# Patient Record
Sex: Male | Born: 2000
Health system: Southern US, Community
[De-identification: ages and names within clinical notes are randomized; demographics above are authoritative.]

## PROBLEM LIST (undated history)

## (undated) DIAGNOSIS — G43909 Migraine, unspecified, not intractable, without status migrainosus: Secondary | ICD-10-CM

## (undated) HISTORY — PX: CHOLECYSTECTOMY: SHX55

## (undated) HISTORY — PX: APPENDECTOMY: SHX54

## (undated) HISTORY — PX: TONSILLECTOMY AND ADENOIDECTOMY: SUR1326

---
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
## 2020-01-01 LAB — URIC ACID: Uric Acid, Serum: 6.6 mg/dL (ref 2.1–7.6)

## 2020-01-01 LAB — CK: Total CK: 77 U/L (ref <245)

## 2020-01-02 ENCOUNTER — Other Ambulatory Visit: Payer: Self-pay

## 2020-01-02 ENCOUNTER — Ambulatory Visit (INDEPENDENT_AMBULATORY_CARE_PROVIDER_SITE_OTHER): Payer: BLUE CROSS/BLUE SHIELD | Admitting: Physician Assistant

## 2020-01-02 ENCOUNTER — Encounter: Payer: Self-pay | Admitting: Physician Assistant

## 2020-01-02 VITALS — BP 128/80 | HR 61 | Temp 98.6°F | Ht 70.0 in | Wt 194.0 lb

## 2020-01-02 DIAGNOSIS — Z23 Encounter for immunization: Secondary | ICD-10-CM

## 2020-01-02 DIAGNOSIS — M7989 Other specified soft tissue disorders: Secondary | ICD-10-CM

## 2020-01-02 MED ORDER — KETOROLAC TROMETHAMINE 60 MG/2ML IM SOLN
60.0000 mg | Freq: Once | INTRAMUSCULAR | Status: AC
  Administered 2020-01-02: 60 mg via INTRAMUSCULAR

## 2020-01-02 NOTE — Patient Instructions (Addendum)
It was great to see you!  Toradol injection today Start oral diclofenac tomorrow  Labs today to check for autoimmune issues Will go ahead and put in a rheumatology consult so they can schedule you  An order for an xray has been put in for you. To get your xray, you can walk in at the [facility] location without a scheduled appointment.  The address is [address] It is across the street from [facility] X-ray is located in the basement.  Hours of operation are M-F 8:30am to 5:00pm. Please note that they are closed for lunch between 12:30 and 1:00pm.  If your swelling worsens, pain, numbness or tingling worsens, there is a medical emergency called compartment syndrome that can develop. If you find that you symptoms are worsening, please go the ER.  Jarold Motto PA-C

## 2020-01-02 NOTE — Progress Notes (Signed)
Scott Durham is a 19 y.o. male here for a new problem.  I acted as a Neurosurgeon for [employer], PA-C Corky Mull, LPN   History of Present Illness:   Chief Complaint  Patient presents with  . Edema     HPI   Edema Pt c/o bilateral hand swelling, right hand has numbness and tingling in pinky and middle finger. Left hand numbness and tingling in whole and and all fingers. Started on Saturday. He went to Urgent care on Sunday and they prescribed prednisone, not helping.  Denies unintentional weight changes, unusual abdominal pain, changes to hand hygiene (gloves/chemicals, etc.)  Denies recent URI symptoms. He has reduced grip strength  Had a normal CK, uric acid at the urgent care. Had a WBC count drawn but I am unable to see these results.  Past Medical History:  Diagnosis Date  . Migraine    saw neurology in teenage years- now off all medicines. had been on amitriptyline, sumatriptan, and nauseas medicine      Social History   Tobacco Use  . Smoking status: Never Smoker  . Smokeless tobacco: Never Used  Vaping Use  . Vaping Use: Never used  Substance Use Topics  . Alcohol use: No  . Drug use: Yes    Types: Marijuana    Past Surgical History:  Procedure Laterality Date  . APPENDECTOMY    . CHOLECYSTECTOMY    . TONSILLECTOMY AND ADENOIDECTOMY      Family History  Problem Relation Age of Onset  . Hiatal hernia Mother   . GER disease Mother   . Other Father        unknown history  . Hyperlipidemia Maternal Grandmother   . Hyperlipidemia Maternal Grandfather   . Aortic aneurysm Paternal Grandmother        had surgery  . Other Paternal Grandfather        back pain issue,   . Cancer - Other Paternal Grandfather     Allergies  Allergen Reactions  . Penicillins Rash  . Benzoyl Peroxide     hives  . Adhesive [Tape] Rash    Current Medications:   Current Outpatient Medications:  .  naproxen (NAPROSYN) 500 MG tablet, Take 1 tablet (500 mg total) by  mouth 2 (two) times daily., Disp: 30 tablet, Rfl: 0 .  predniSONE (DELTASONE) 20 MG tablet, Take one tab by mouth twice daily for 4 days, then one daily. Take with food., Disp: 12 tablet, Rfl: 0   Review of Systems:   ROS  Negative unless otherwise specified per HPI.  Vitals:   Vitals:   01/02/20 1001  BP: 128/80  Pulse: 61  Temp: 98.6 F (37 C)  TempSrc: Temporal  SpO2: 98%  Weight: 194 lb (88 kg)  Height: 5\' 10"  (1.778 m)     Body mass index is 27.84 kg/m.  Physical Exam:   Physical Exam Vitals and nursing note reviewed.  Constitutional:      General: He is not in acute distress.    Appearance: He is well-developed. He is not ill-appearing or toxic-appearing.  Cardiovascular:     Rate and Rhythm: Normal rate and regular rhythm.     Pulses: Normal pulses.     Heart sounds: Normal heart sounds, S1 normal and S2 normal.     Comments: No LE edema Normal cap refills in fingertips Pulmonary:     Effort: Pulmonary effort is normal.     Breath sounds: Normal breath sounds.  Musculoskeletal:  Comments: Mild swelling to bilateral hands, diffuse TTP to b/l thenar eminence Normal ROM; able to oppose thumb to fingers without issue No erythema or warmth  Skin:    General: Skin is warm and dry.  Neurological:     Mental Status: He is alert.     GCS: GCS eye subscore is 4. GCS verbal subscore is 5. GCS motor subscore is 6.     Comments: Grip strength 4/5 bilaterally Normal perceived sensation   Psychiatric:        Speech: Speech normal.        Behavior: Behavior normal. Behavior is cooperative.      Assessment and Plan:   Keelin was seen today for edema.  Diagnoses and all orders for this visit:  Bilateral hand swelling Also examined briefly by Dr. Jimmey Ralph. Differential broad, suspect potential autoimmune condition, will order relevant labs and obtain b/l hand xrays today. Stop prednisone. Toradol injection given today. Start oral diclofenac tomorrow. Has  free ROM and overall normal evidence of blood flow.   We did extensively review signs, symptoms and pathophysiology of compartment syndrome -- low threshold to go to the ER if worsening symptoms. Also, will place rheumatology referral for further work-up. -     CBC with Differential/Platelet; Future -     Comprehensive metabolic panel; Future -     Sedimentation rate; Future -     C-reactive protein; Future -     ANA; Future -     Rheumatoid Factor; Future -     DG Hand Complete Left; Future -     DG Hand Complete Right; Future -     ketorolac (TORADOL) injection 60 mg -     ANA -     Rheumatoid Factor -     C-reactive protein -     Sedimentation rate -     Comprehensive metabolic panel -     CBC with Differential/Platelet  CMA or LPN served as scribe during this visit. History, Physical, and Plan performed by medical provider. The above documentation has been reviewed and is accurate and complete.  Jarold Motto, PA-C

## 2020-01-03 ENCOUNTER — Other Ambulatory Visit: Payer: Self-pay | Admitting: *Deleted

## 2020-01-03 LAB — COMPREHENSIVE METABOLIC PANEL
AG Ratio: 2 (calc) (ref 1.0–2.5)
ALT: 20 U/L (ref 8–46)
AST: 17 U/L (ref 12–32)
Albumin: 4.5 g/dL (ref 3.6–5.1)
Alkaline phosphatase (APISO): 67 U/L (ref 46–169)
BUN: 13 mg/dL (ref 7–20)
CO2: 27 mmol/L (ref 20–32)
Calcium: 9.5 mg/dL (ref 8.9–10.4)
Chloride: 105 mmol/L (ref 98–110)
Creat: 0.86 mg/dL (ref 0.60–1.26)
Globulin: 2.3 g/dL (calc) (ref 2.1–3.5)
Glucose, Bld: 86 mg/dL (ref 65–99)
Potassium: 4.2 mmol/L (ref 3.8–5.1)
Sodium: 139 mmol/L (ref 135–146)
Total Bilirubin: 0.7 mg/dL (ref 0.2–1.1)
Total Protein: 6.8 g/dL (ref 6.3–8.2)

## 2020-01-03 LAB — CBC WITH DIFFERENTIAL/PLATELET
Absolute Monocytes: 710 cells/uL (ref 200–900)
Basophils Absolute: 40 cells/uL (ref 0–200)
Basophils Relative: 0.4 %
Eosinophils Absolute: 30 cells/uL (ref 15–500)
Eosinophils Relative: 0.3 %
HCT: 46.1 % (ref 36.0–49.0)
Hemoglobin: 15.6 g/dL (ref 12.0–16.9)
Lymphs Abs: 2480 cells/uL (ref 1200–5200)
MCH: 29.5 pg (ref 25.0–35.0)
MCHC: 33.8 g/dL (ref 31.0–36.0)
MCV: 87.1 fL (ref 78.0–98.0)
MPV: 9.6 fL (ref 7.5–12.5)
Monocytes Relative: 7.1 %
Neutro Abs: 6740 cells/uL (ref 1800–8000)
Neutrophils Relative %: 67.4 %
Platelets: 275 10*3/uL (ref 140–400)
RBC: 5.29 10*6/uL (ref 4.10–5.70)
RDW: 12.4 % (ref 11.0–15.0)
Total Lymphocyte: 24.8 %
WBC: 10 10*3/uL (ref 4.5–13.0)

## 2020-01-03 LAB — RHEUMATOID FACTOR: Rheumatoid fact SerPl-aCnc: <14 IU/mL (ref <14)

## 2020-01-03 LAB — ANA: Anti Nuclear Antibody (ANA): NEGATIVE

## 2020-01-03 LAB — SEDIMENTATION RATE: Sed Rate: 2 mm/h (ref 0–15)

## 2020-01-03 LAB — C-REACTIVE PROTEIN: CRP: 0.9 mg/L (ref <8.0)

## 2020-01-03 MED ORDER — PREDNISONE 20 MG PO TABS: ORAL_TABLET | ORAL | 0 refills | Status: DC

## 2020-01-03 MED ORDER — DICLOFENAC SODIUM 75 MG PO TBEC: 75.0000 mg | DELAYED_RELEASE_TABLET | Freq: Two times a day (BID) | ORAL | 0 refills | Status: DC

## 2020-01-03 MED FILL — DICLOFENAC SODIUM 75 MG TAB: 75 | 30 days supply | Qty: 60 | Fill #0

## 2020-01-04 ENCOUNTER — Other Ambulatory Visit: Payer: Self-pay

## 2020-01-04 ENCOUNTER — Ambulatory Visit (INDEPENDENT_AMBULATORY_CARE_PROVIDER_SITE_OTHER)
Admission: RE | Admit: 2020-01-04 | Discharge: 2020-01-04 | Disposition: A | Discharge status: Home / Self Care | Payer: BLUE CROSS/BLUE SHIELD | Source: Ambulatory Visit | Attending: Physician Assistant | Admitting: Physician Assistant

## 2020-01-04 DIAGNOSIS — M7989 Other specified soft tissue disorders: Secondary | ICD-10-CM | POA: Diagnosis not present

## 2020-01-04 IMAGING — DX DG HAND COMPLETE 3+V*L*
3 series · 3 of 3 positions shown · non-contrast
Comparison: None.

CLINICAL DATA: Pain and swelling for several days

EXAM:
LEFT HAND - COMPLETE 3+ VIEW

[hand ap]
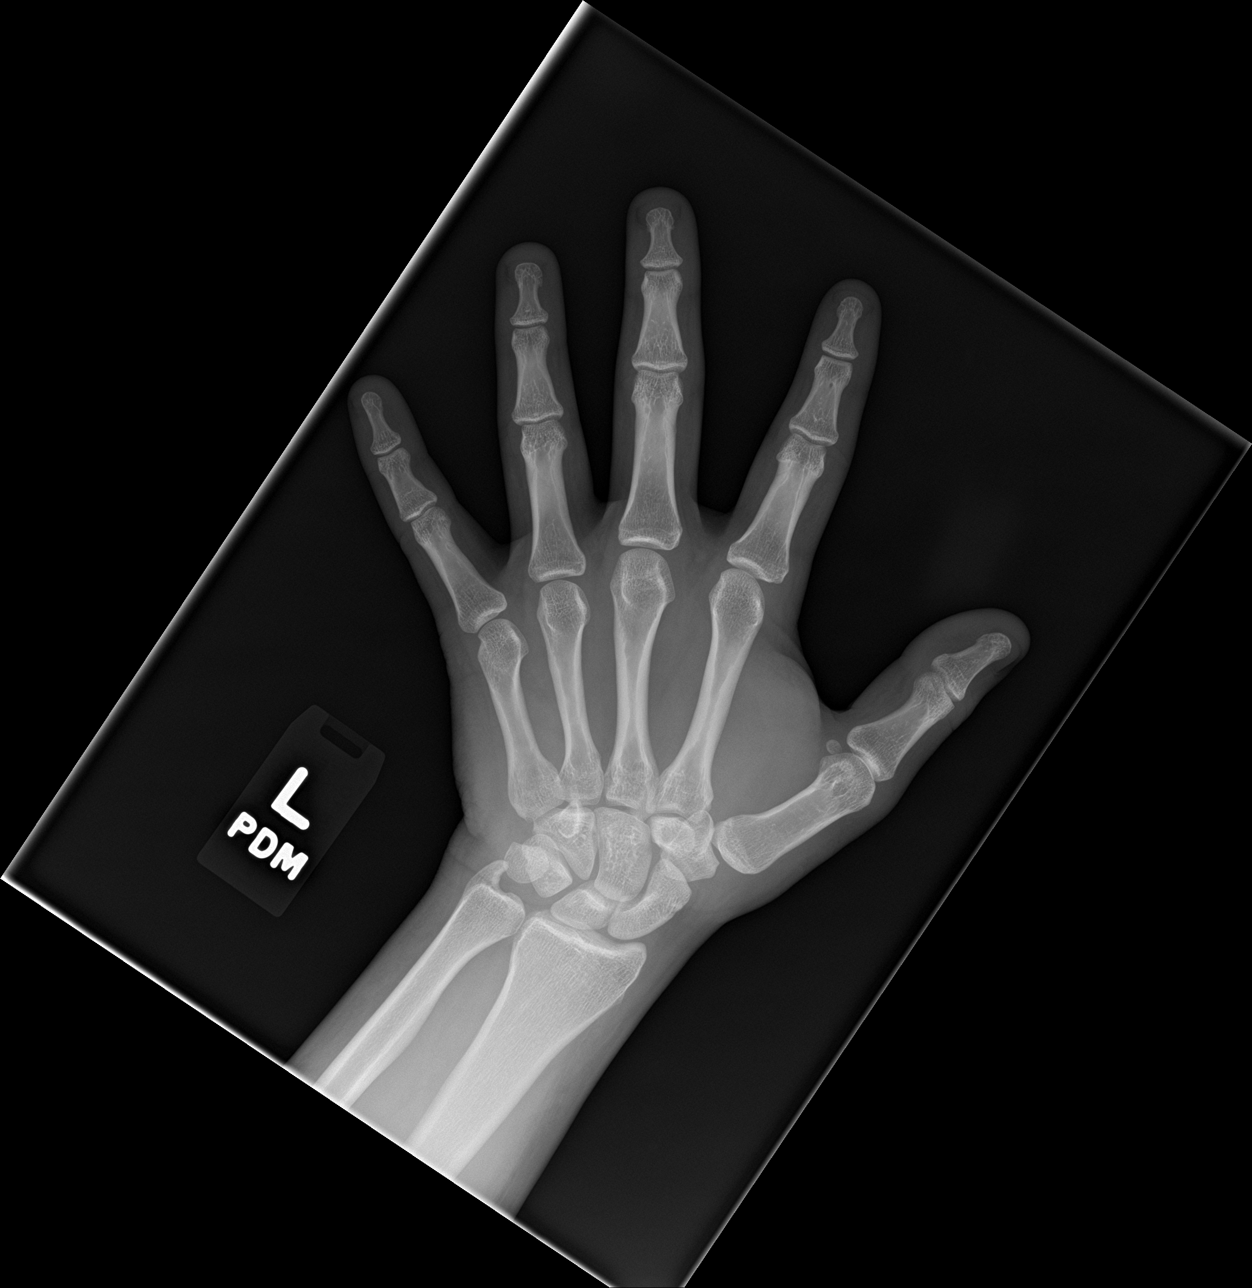

[hand obl]
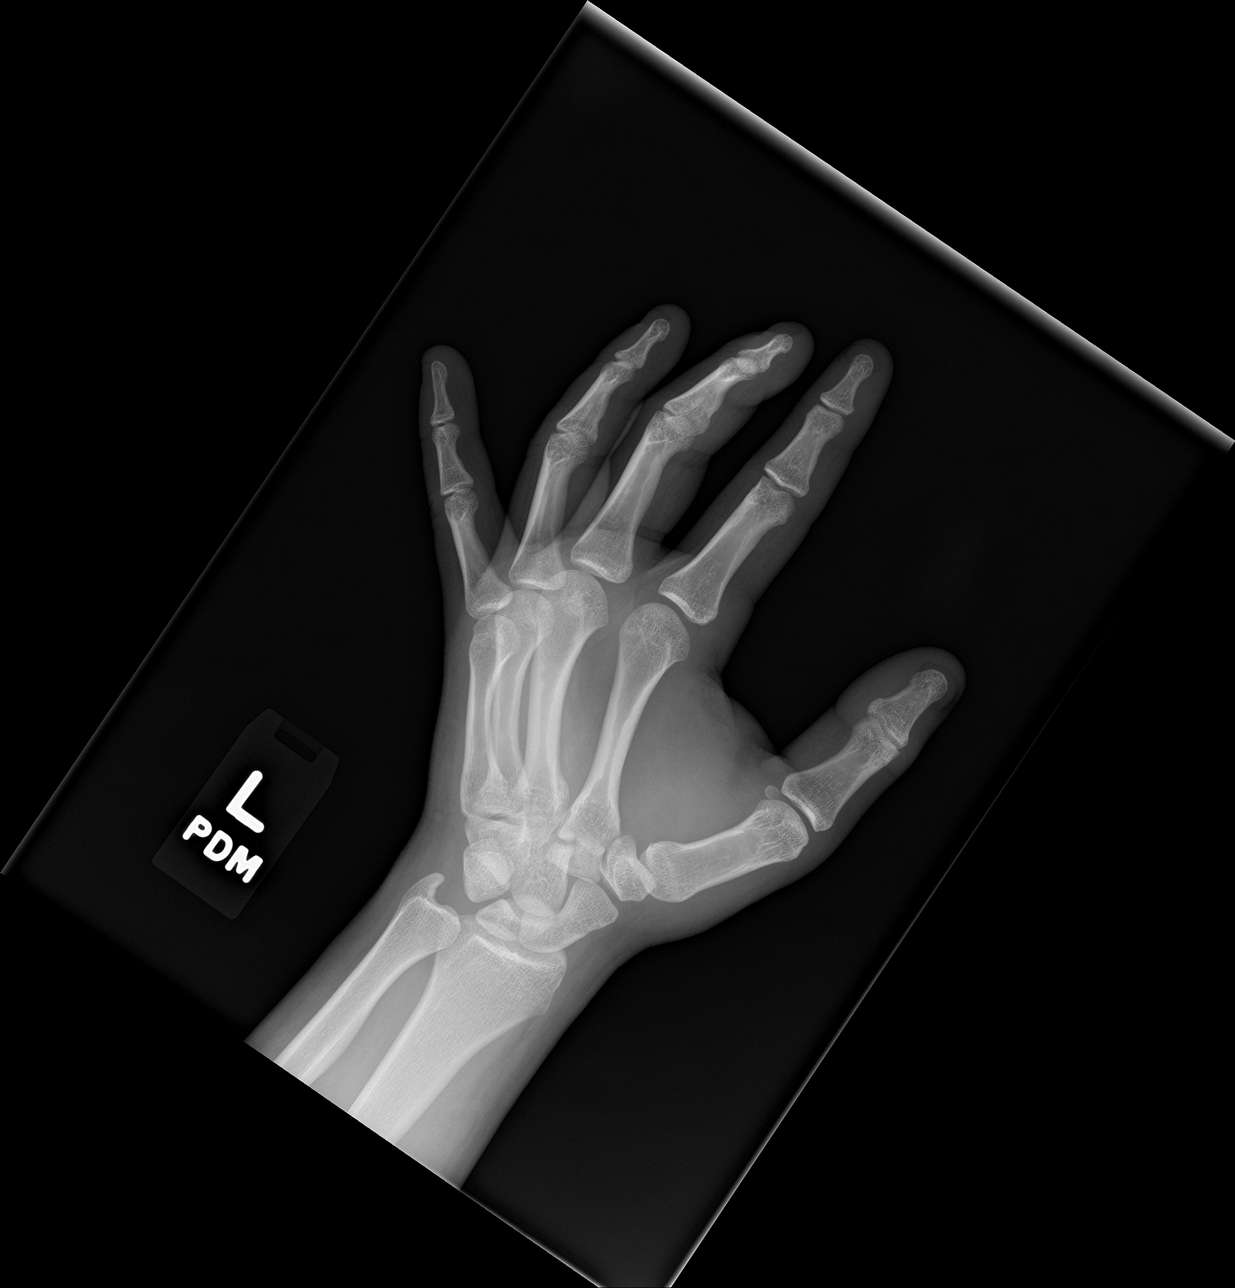

[hand lat]
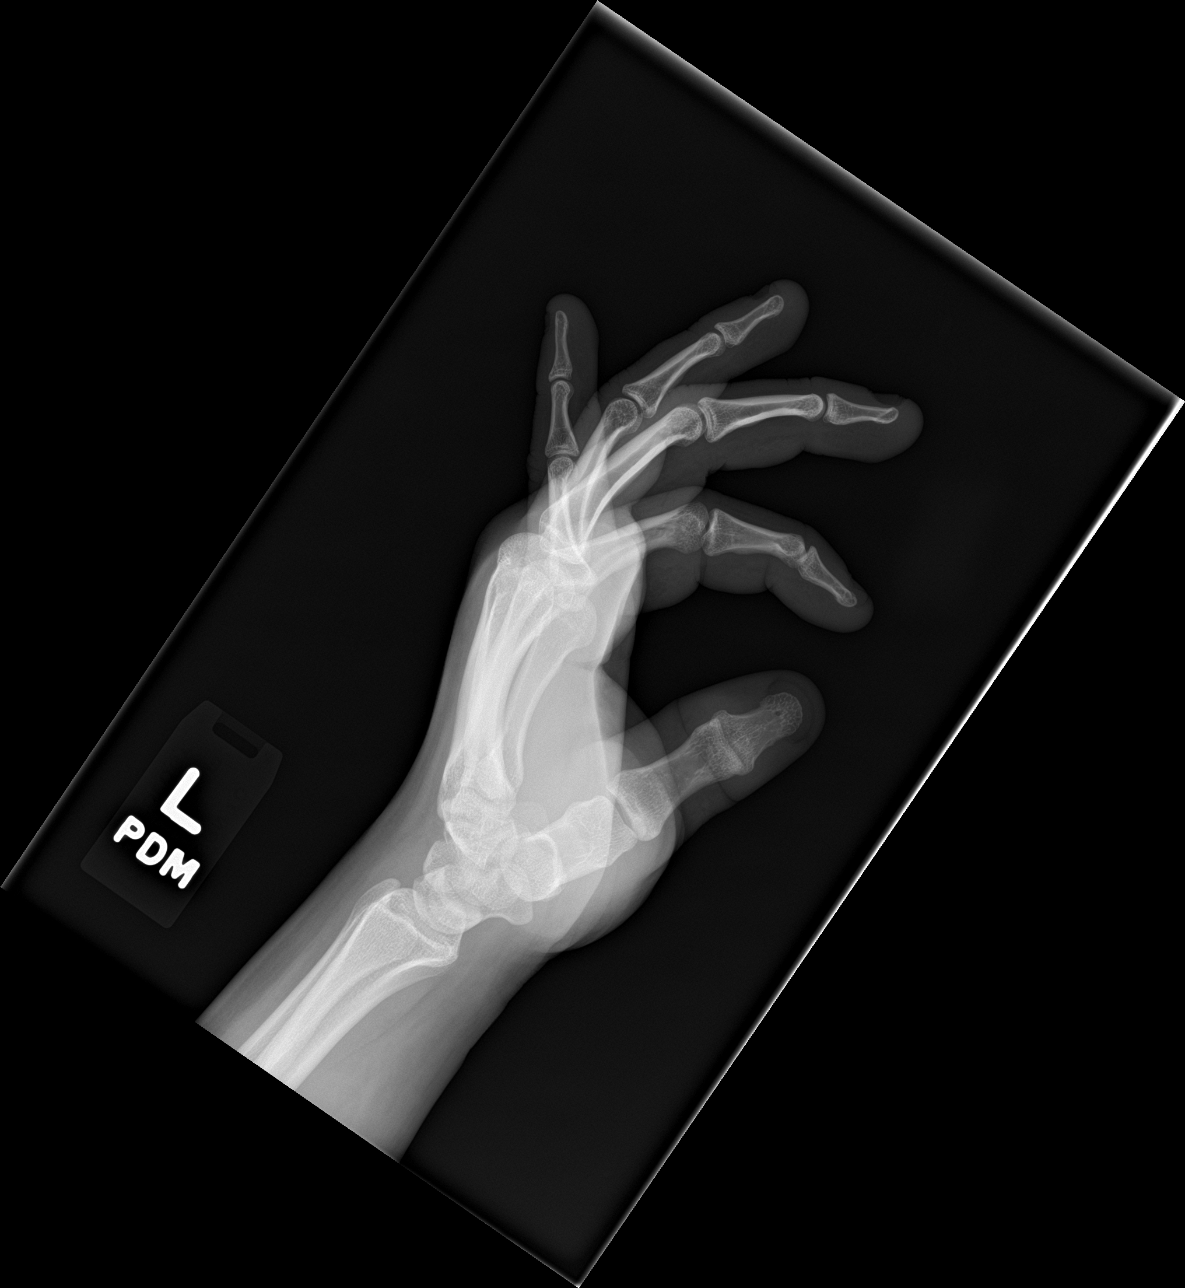

[3 of 3 positions shown; findings below may reference images not displayed]

FINDINGS: Frontal, oblique, and lateral views were obtained. No fracture or
dislocation. Joint spaces appear normal. No erosive change or
periostitis. Bony mineralization is normal.
IMPRESSION: No fracture or dislocation.  No evident arthropathy.

## 2020-01-04 IMAGING — DX DG HAND COMPLETE 3+V*R*
3 series · 3 of 3 positions shown · non-contrast
Comparison: None.

CLINICAL DATA: Pain and swelling

EXAM:
RIGHT HAND - COMPLETE 3+ VIEW

[hand ap]
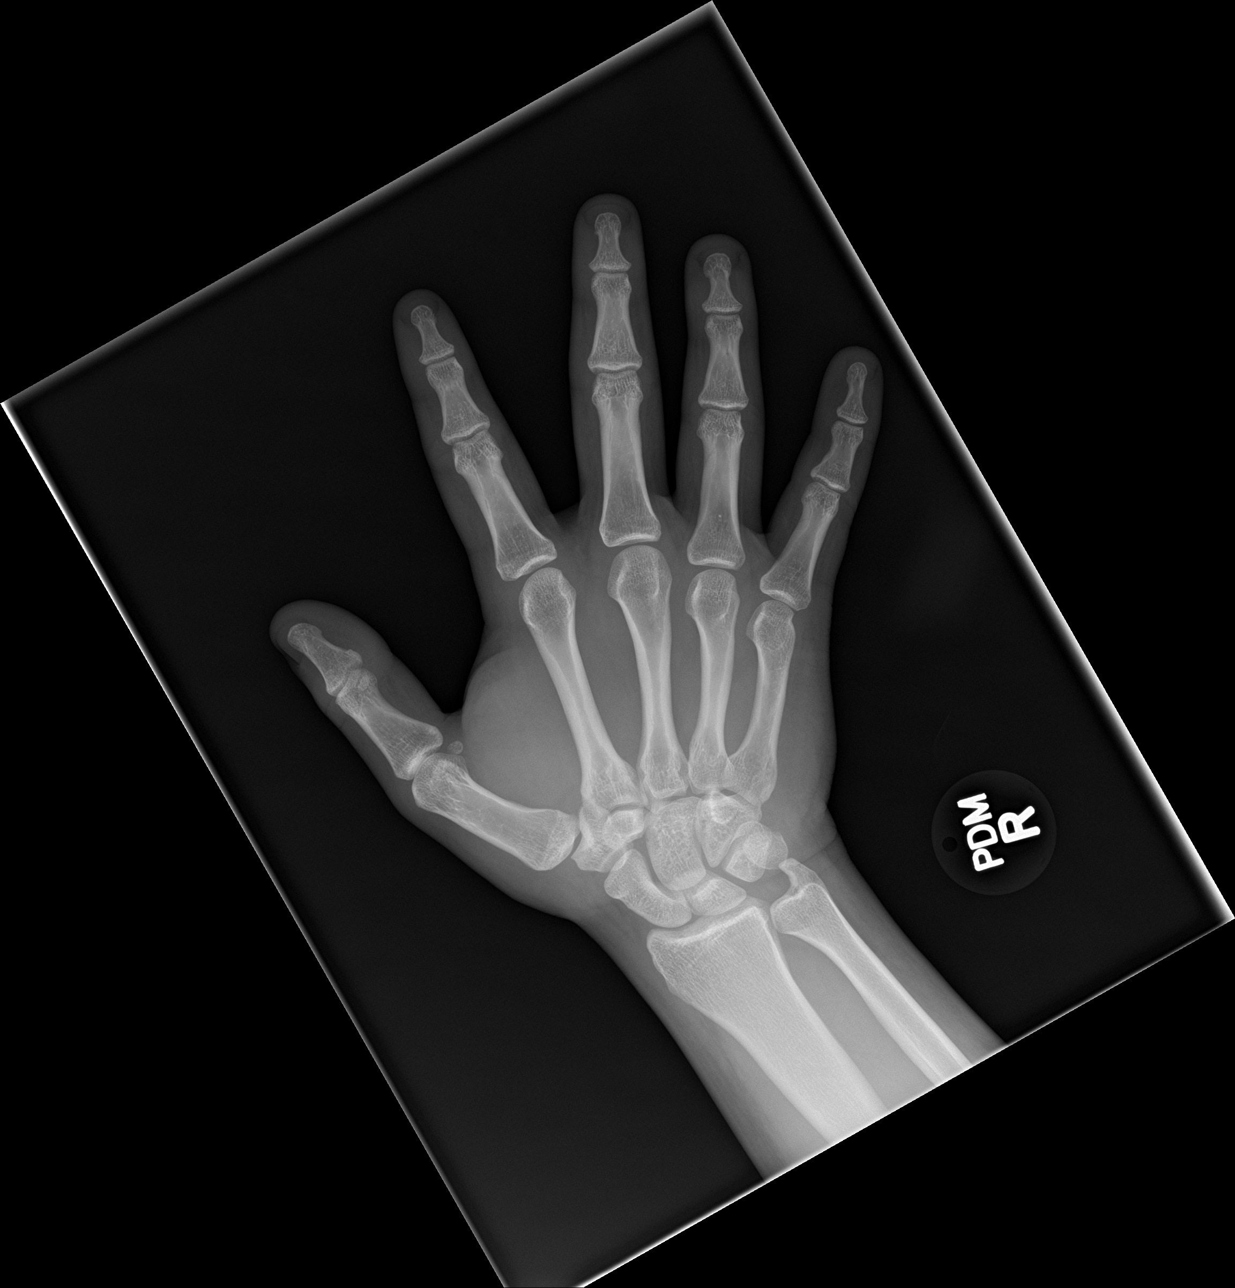

[hand obl]
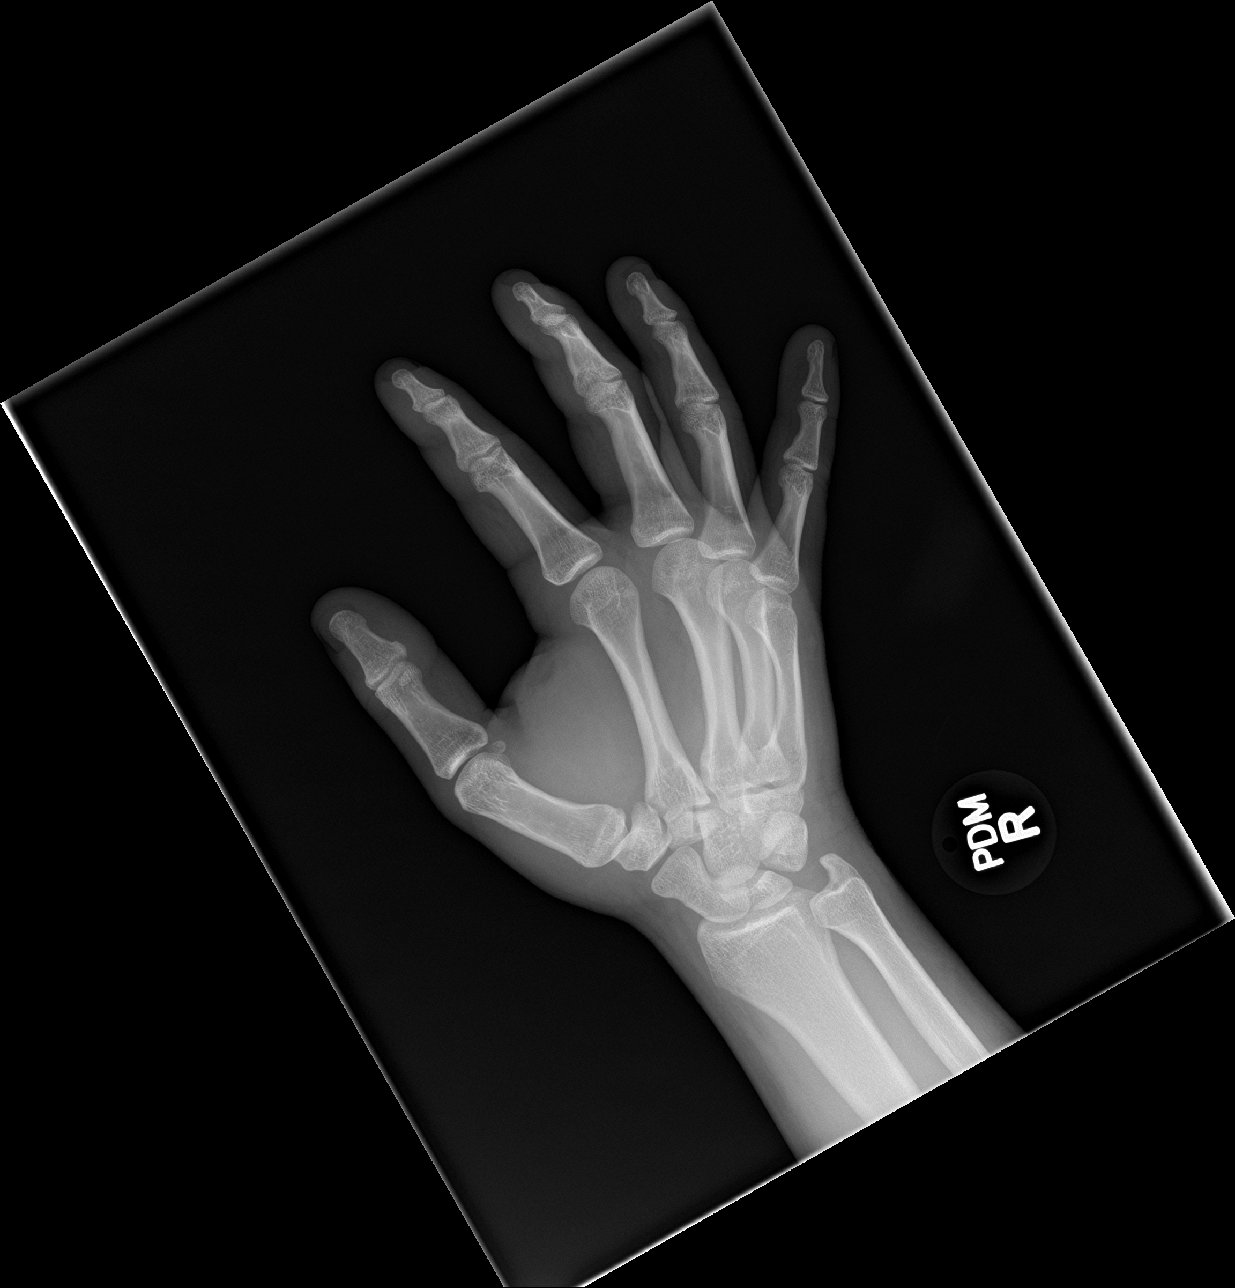

[hand lat]
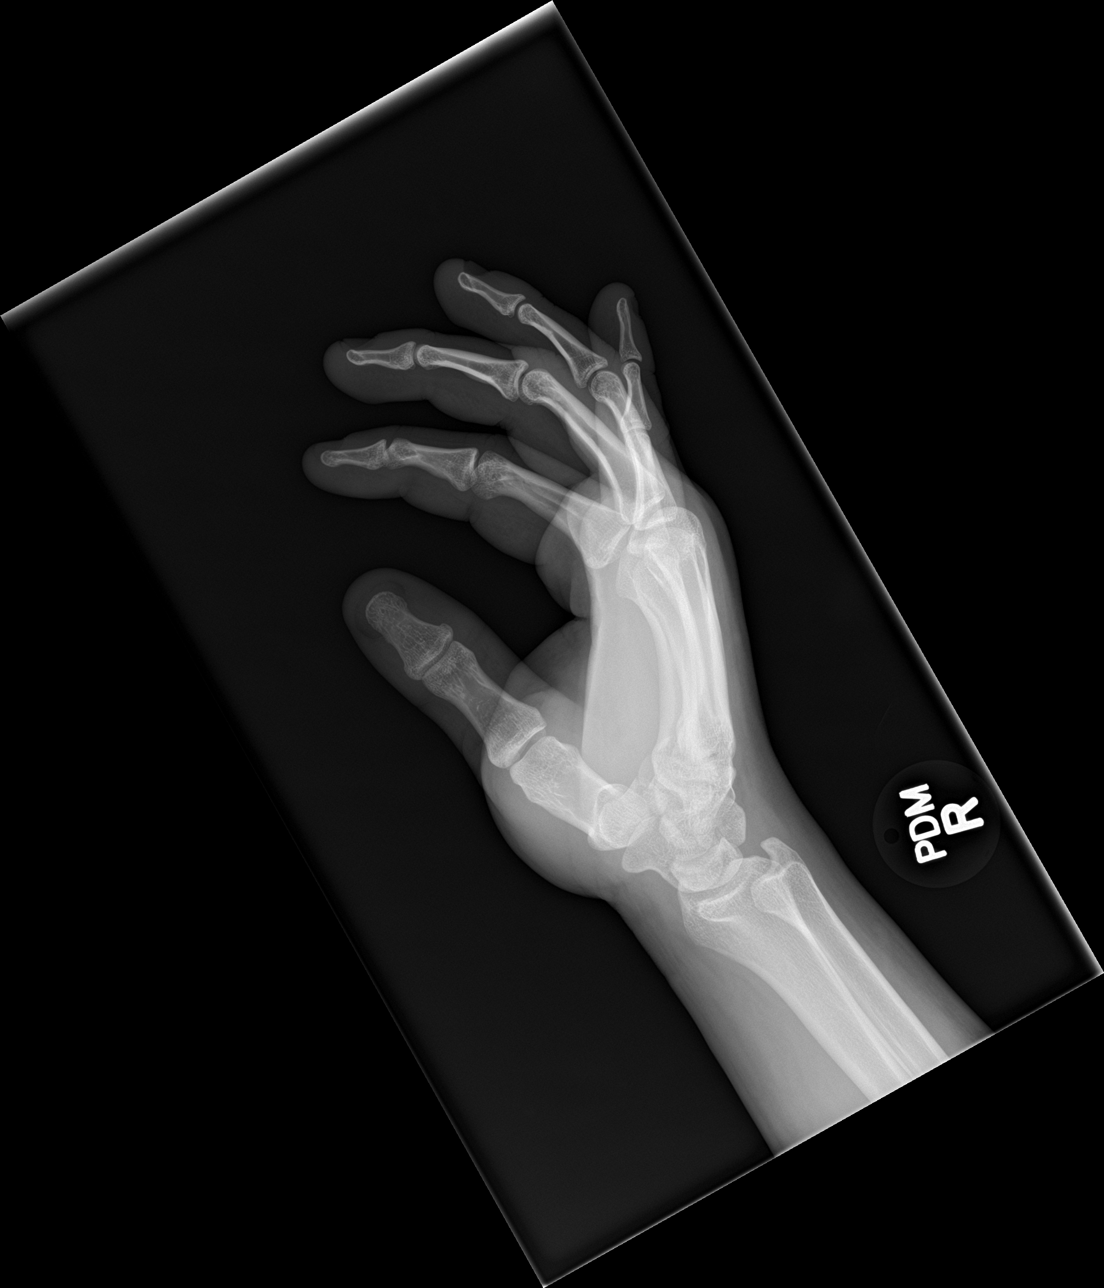

[3 of 3 positions shown; findings below may reference images not displayed]

FINDINGS: Frontal, oblique, and lateral views were obtained. There is no
fracture or dislocation. The joint spaces appear normal. No erosive
change or periostitis. Bony mineralization appears normal.
IMPRESSION: No fracture or dislocation.  No evident arthropathy.

## 2020-01-05 ENCOUNTER — Other Ambulatory Visit: Payer: Self-pay | Admitting: Physician Assistant

## 2020-01-05 MED ORDER — PREDNISONE 50 MG PO TABS: ORAL_TABLET | ORAL | 0 refills | Status: DC

## 2020-01-05 MED FILL — predniSONE 50 MG TABS: 50 | 7 days supply | Qty: 7 | Fill #0

## 2020-01-08 ENCOUNTER — Encounter: Payer: Self-pay | Admitting: Physician Assistant

## 2020-01-08 ENCOUNTER — Ambulatory Visit (INDEPENDENT_AMBULATORY_CARE_PROVIDER_SITE_OTHER): Payer: BLUE CROSS/BLUE SHIELD | Admitting: Physician Assistant

## 2020-01-08 ENCOUNTER — Other Ambulatory Visit: Payer: Self-pay | Admitting: Physician Assistant

## 2020-01-08 ENCOUNTER — Other Ambulatory Visit: Payer: Self-pay

## 2020-01-08 VITALS — BP 120/70 | HR 69 | Temp 98.4°F | Ht 70.0 in | Wt 198.2 lb

## 2020-01-08 DIAGNOSIS — M7989 Other specified soft tissue disorders: Secondary | ICD-10-CM | POA: Diagnosis not present

## 2020-01-08 MED ORDER — DICLOFENAC SODIUM 1 % EX GEL: 4.0000 g | Freq: Four times a day (QID) | CUTANEOUS | 3 refills | Status: DC

## 2020-01-08 MED FILL — DICLOFENAC SODIUM 1 % GEL: 1 | 6 days supply | Qty: 100 | Fill #0

## 2020-01-08 NOTE — Patient Instructions (Signed)
It was great to see you!  You will be referred to sports medicine - they will contact you about an appointment.  Trial topical voltaren four times daily -- you can buy this over the counter. Generic is fine.   Finish out the prednisone.  Take care,  Jarold Motto PA-C

## 2020-01-08 NOTE — Progress Notes (Signed)
Scott Durham is a 19 y.o. male is here for follow up.  I acted as a Scott Durham for [employer], PA-C Scott Mull, LPN   History of Present Illness:   Chief Complaint  Patient presents with  . hand swelling    HPI   Hand swelling Pt here today for f/u, says hands are worse and more numbness. Has taken 50 mg oral prednisone without significant relief of symptoms. Reports ongoing worsening grip strength as well. Has been pulled out of work due to his symptoms.   Health Maintenance Due  Topic Date Due  . Hepatitis C Screening  Never done    Past Medical History:  Diagnosis Date  . Migraine    saw neurology in teenage years- now off all medicines. had been on amitriptyline, sumatriptan, and nauseas medicine      Social History   Tobacco Use  . Smoking status: Never Smoker  . Smokeless tobacco: Never Used  Vaping Use  . Vaping Use: Never used  Substance Use Topics  . Alcohol use: No  . Drug use: Yes    Types: Marijuana    Past Surgical History:  Procedure Laterality Date  . APPENDECTOMY    . CHOLECYSTECTOMY    . TONSILLECTOMY AND ADENOIDECTOMY      Family History  Problem Relation Age of Onset  . Hiatal hernia Mother   . GER disease Mother   . Other Father        unknown history  . Hyperlipidemia Maternal Grandmother   . Hyperlipidemia Maternal Grandfather   . Aortic aneurysm Paternal Grandmother        had surgery  . Other Paternal Grandfather        back pain issue,   . Cancer - Other Paternal Grandfather     PMHx, SurgHx, SocialHx, FamHx, Medications, and Allergies were reviewed in the Visit Navigator and updated as appropriate.   Patient Active Problem List   Diagnosis Date Noted  . Migraine   . Allergic rhinitis 07/03/2015  . Acne vulgaris 07/03/2015    Social History   Tobacco Use  . Smoking status: Never Smoker  . Smokeless tobacco: Never Used  Vaping Use  . Vaping Use: Never used  Substance Use Topics  . Alcohol use: No  .  Drug use: Yes    Types: Marijuana    Current Medications and Allergies:    Current Outpatient Medications:  .  diclofenac Sodium (VOLTAREN) 1 % GEL, Apply 4 g topically 4 (four) times daily., Disp: 50 g, Rfl: 3 .  predniSONE (DELTASONE) 50 MG tablet, Take 1 tablet daily, Disp: 7 tablet, Rfl: 0   Allergies  Allergen Reactions  . Penicillins Rash  . Benzoyl Peroxide     hives  . Adhesive [Tape] Rash    Review of Systems   ROS  Negative unless otherwise specified per HPI.  Vitals:   Vitals:   01/08/20 1012  BP: 120/70  Pulse: 69  Temp: 98.4 F (36.9 C)  TempSrc: Temporal  SpO2: 97%  Weight: 198 lb 4 oz (89.9 kg)  Height: 5\' 10"  (1.778 m)     Body mass index is 28.45 kg/m.   Physical Exam:    Physical Exam Musculoskeletal:     Comments: Bilateral hands with generalized swelling Tinel's sign positive Reduced ROM with gripping 2/2 swelling and pain  Neurological:     Comments: Decreased grip strength b/l Decreased perceived sensation to bilateral fingertips      Assessment and Plan:  Scott Durham was seen today for hand swelling.  Diagnoses and all orders for this visit:  Bilateral hand swelling Urgent referral to sports medicine for further evaluation and management. Continue oral prednisone. Topical voltaren QID as needed. -     Ambulatory referral to Sports Medicine  Other orders -     diclofenac Sodium (VOLTAREN) 1 % GEL; Apply 4 g topically 4 (four) times daily.  CMA or LPN served as scribe during this visit. History, Physical, and Plan performed by medical provider. The above documentation has been reviewed and is accurate and complete.  Scott Motto, PA-C [place] [place] 01/08/2020  Follow-up: No follow-ups on file.

## 2020-01-09 ENCOUNTER — Ambulatory Visit (INDEPENDENT_AMBULATORY_CARE_PROVIDER_SITE_OTHER): Payer: BLUE CROSS/BLUE SHIELD | Admitting: Family Medicine

## 2020-01-09 ENCOUNTER — Other Ambulatory Visit: Payer: Self-pay | Admitting: Family Medicine

## 2020-01-09 ENCOUNTER — Ambulatory Visit: Payer: Self-pay

## 2020-01-09 ENCOUNTER — Encounter: Payer: Self-pay | Admitting: Family Medicine

## 2020-01-09 VITALS — BP 122/82 | HR 74 | Ht 70.0 in | Wt 198.0 lb

## 2020-01-09 DIAGNOSIS — R200 Anesthesia of skin: Secondary | ICD-10-CM | POA: Diagnosis not present

## 2020-01-09 DIAGNOSIS — G5603 Carpal tunnel syndrome, bilateral upper limbs: Secondary | ICD-10-CM | POA: Diagnosis not present

## 2020-01-09 MED ORDER — GABAPENTIN 300 MG PO CAPS: 300.0000 mg | ORAL_CAPSULE | Freq: Three times a day (TID) | ORAL | 3 refills | Status: DC | PRN

## 2020-01-09 MED FILL — GABAPENTIN 300 MG CAPSULE: 300 | 30 days supply | Qty: 90 | Fill #0

## 2020-01-09 NOTE — Progress Notes (Signed)
Subjective:    I'm seeing this patient as a consultation for:  Scott Durham, [place] and Dr. Durene Cal. Note will be routed back to referring provider/PCP.  CC: B hand swelling and numbness/tingling  I, Debbe Odea, am serving as a Neurosurgeon for Dr. Clementeen Graham.  HPI: Pt is an 19 y/o male presenting w/ c/o B hand swelling and numbness/tingling that began on 12/30/19.  He has been seen at Urgent Care and twice by his PCP w/ similar c/o.  He has been referred to Rheumatology appointment 01/31/2020 for first appointment. He denies any change in activity or injury. He was prescribed prednisone twice for this which made no difference. He has not had trials of gabapentin or Lyrica yet. He notes some numbness and tingling throughout his entire hand but predominantly located at the radial side of his hands on both sides.  Swelling: yes in B hands UE numbness/tingling: Yes in R 3rd and 5th fingers and L entire hand  UE weakness: yes, decreased grip strength Aggravating factors: gripping Treatments tried: prednisone; Toradol injection; oral diclofenac; voltaren gel  Diagnostic imaging: R and L hand XR- 01/04/20  Past medical history, Surgical history, Family history, Social history, Allergies, and medications have been entered into the medical record, reviewed.   Review of Systems: No new headache, visual changes, nausea, vomiting, diarrhea, constipation, dizziness, abdominal pain, skin rash, fevers, chills, night sweats, weight loss, swollen lymph nodes, body aches, joint swelling, muscle aches, chest pain, shortness of breath, mood changes, visual or auditory hallucinations.   Objective:    Vitals:   01/09/20 1112  BP: 122/82  Pulse: 74  SpO2: 99%   General: Well Developed, well nourished, and in no acute distress.  Neuro/Psych: Alert and oriented x3, extra-ocular muscles intact, able to move all 4 extremities, sensation grossly intact. Skin: Warm and dry, no rashes noted.  Respiratory: Not using  accessory muscles, speaking in full sentences, trachea midline.  Cardiovascular: Pulses palpable, no extremity edema. Abdomen: Does not appear distended. MSK: Hands bilaterally are normal-appearing with no significant swelling ulnar deviation. Not particularly tender to palpation. Positive Tinel's bilateral carpal tunnel and cubital tunnel. Positive Phalen's test bilaterally. Grip strength is intact distally bilaterally.  Lab and Radiology Results  Diagnostic Limited MSK Ultrasound of: Bilateral carpal tunnel Right carpal tunnel: Flattened median nerve with cross-sectional area of 17 mm. Left carpal tunnel flattened median nerve with cross-sectional area of 13 mm. Impression: Mild carpal tunnel syndrome left worse than right  DG Ankle Complete Left  Result Date: 11/20/2019 CLINICAL DATA:  Crush injury. Crush injury to the foot and ankle tonight at work. Bruising swelling, and cannot bear weight. EXAM: LEFT ANKLE COMPLETE - 3+ VIEW COMPARISON:  None. FINDINGS: There is no evidence of fracture or dislocation. The ankle mortise is preserved. There is no evidence of arthropathy or other focal bone abnormality. Small ankle joint effusion. Soft tissue edema appears more prominent laterally. IMPRESSION: 1. Soft tissue edema and small joint effusion. 2. No fracture or dislocation. Electronically Signed   By: Narda Rutherford M.D.   On: 11/20/2019 00:09   DG Hand Complete Left  Result Date: 01/05/2020 CLINICAL DATA:  Pain and swelling for several days EXAM: LEFT HAND - COMPLETE 3+ VIEW COMPARISON:  None. FINDINGS: Frontal, oblique, and lateral views were obtained. No fracture or dislocation. Joint spaces appear normal. No erosive change or periostitis. Bony mineralization is normal. IMPRESSION: No fracture or dislocation.  No evident arthropathy. Electronically Signed   By: Bretta Bang III  M.D.   On: 01/05/2020 14:34   DG Hand Complete Right  Result Date: 01/05/2020 CLINICAL DATA:  Pain and  swelling EXAM: RIGHT HAND - COMPLETE 3+ VIEW COMPARISON:  None. FINDINGS: Frontal, oblique, and lateral views were obtained. There is no fracture or dislocation. The joint spaces appear normal. No erosive change or periostitis. Bony mineralization appears normal. IMPRESSION: No fracture or dislocation.  No evident arthropathy. Electronically Signed   By: Bretta BangWilliam  Woodruff III M.D.   On: 01/05/2020 14:35   DG Foot Complete Left  Result Date: 11/20/2019 CLINICAL DATA:  Crush injury. Crush injury to the foot and ankle tonight at work. Bruising swelling, and cannot bear weight. EXAM: LEFT FOOT - COMPLETE 3+ VIEW COMPARISON:  None. FINDINGS: There is no evidence of fracture or dislocation. There is no evidence of arthropathy or other focal bone abnormality. Soft tissue edema about the hindfoot. IMPRESSION: Soft tissue edema.  No fracture or dislocation. Electronically Signed   By: Narda RutherfordMelanie  Sanford M.D.   On: 11/20/2019 00:10   I, Clementeen GrahamEvan Merdith Boyd, personally (independently) visualized and performed the interpretation of the hand images attached in this note.  Recent Results (from the past 2160 hour(s))  POCT CBC w auto diff     Status: None   Collection Time: 12/31/19  2:17 PM  Result Value Ref Range   WBC      Comment: see scanned results   Lymphocytes relative %     Monocytes relative %     Neutrophils relative % (GR)     Lymphocytes absolute     Monocyes absolute     Neutrophils absolute (GR#)     RBC     Hemoglobin     Hematocrit     MCV     MCH     MCHC     RDW     Platelet count     MPV    Uric acid     Status: None   Collection Time: 12/31/19  2:17 PM  Result Value Ref Range   Uric Acid, Serum 6.6 2.1 - 7.6 mg/dL  CK     Status: None   Collection Time: 12/31/19  2:17 PM  Result Value Ref Range   Total CK 77 <245 U/L  ANA     Status: None   Collection Time: 01/02/20 11:09 AM  Result Value Ref Range   Anti Nuclear Antibody (ANA) NEGATIVE NEGATIVE    Comment: ANA IFA is a first line  screen for detecting the presence of up to approximately 150 autoantibodies in various autoimmune diseases. A negative ANA IFA result suggests an ANA-associated autoimmune disease is not present at this time, but is not definitive. If there is high clinical suspicion for Sjogren's syndrome, testing for anti-SS-A/Ro antibody should be considered. Anti-Jo-1 antibody should be considered for clinically suspected inflammatory myopathies. . AC-0: Negative . International Consensus on ANA Patterns (SeverTies.uyhttps://doi.org/10.1515/cclm-2018-0052) . For additional information, please refer to http://education.[employer].com/faq/FAQ177 (This link is being provided for informational/ educational purposes only.) .   Rheumatoid Factor     Status: None   Collection Time: 01/02/20 11:09 AM  Result Value Ref Range   Rhuematoid fact SerPl-aCnc <14 <14 IU/mL  C-reactive protein     Status: None   Collection Time: 01/02/20 11:09 AM  Result Value Ref Range   CRP 0.9 <8.0 mg/L  Sedimentation rate     Status: None   Collection Time: 01/02/20 11:09 AM  Result Value Ref Range   Sed Rate 2 0 -  15 mm/h  Comprehensive metabolic panel     Status: None   Collection Time: 01/02/20 11:09 AM  Result Value Ref Range   Glucose, Bld 86 65 - 99 mg/dL    Comment: .            Fasting reference interval .    BUN 13 7 - 20 mg/dL   Creat 7.10 6.26 - 9.48 mg/dL   BUN/Creatinine Ratio NOT APPLICABLE 6 - 22 (calc)   Sodium 139 135 - 146 mmol/L   Potassium 4.2 3.8 - 5.1 mmol/L   Chloride 105 98 - 110 mmol/L   CO2 27 20 - 32 mmol/L   Calcium 9.5 8.9 - 10.4 mg/dL   Total Protein 6.8 6.3 - 8.2 g/dL   Albumin 4.5 3.6 - 5.1 g/dL   Globulin 2.3 2.1 - 3.5 g/dL (calc)   AG Ratio 2.0 1.0 - 2.5 (calc)   Total Bilirubin 0.7 0.2 - 1.1 mg/dL   Alkaline phosphatase (APISO) 67 46 - 169 U/L   AST 17 12 - 32 U/L   ALT 20 8 - 46 U/L  CBC with Differential/Platelet     Status: None   Collection Time: 01/02/20 11:09 AM    Result Value Ref Range   WBC 10.0 4.5 - 13.0 Thousand/uL   RBC 5.29 4.10 - 5.70 Million/uL   Hemoglobin 15.6 12.0 - 16.9 g/dL   HCT 54.6 36 - 49 %   MCV 87.1 78.0 - 98.0 fL   MCH 29.5 25.0 - 35.0 pg   MCHC 33.8 31.0 - 36.0 g/dL   RDW 27.0 35.0 - 09.3 %   Platelets 275 140 - 400 Thousand/uL   MPV 9.6 7.5 - 12.5 fL   Neutro Abs 6,740 1,800 - 8,000 cells/uL   Lymphs Abs 2,480 1,200 - 5,200 cells/uL   Absolute Monocytes 710 200 - 900 cells/uL   Eosinophils Absolute 30 15 - 500 cells/uL   Basophils Absolute 40 0 - 200 cells/uL   Neutrophils Relative % 67.4 %   Total Lymphocyte 24.8 %   Monocytes Relative 7.1 %   Eosinophils Relative 0.3 %   Basophils Relative 0.4 %      Impression and Recommendations:    Assessment and Plan: 19 y.o. male with bilateral hand and wrist pain swelling numbness and tingling. Patient had rheumatologic work-up that so far has been unrevealing. He has had trials of steroids which have not been helpful. I think it is pretty unlikely that he has a rheumatologic problem but not impossible I do agree with his rheumatology appointment that scheduled for later this month.  However I think some of his symptoms today are due to carpal tunnel syndrome. It is also likely that he has some cubital tunnel syndrome as well. Differential does include more rare diagnoses such as thoracic outlet syndrome but this is very unlikely.  Plan to treat with wrist splints and gabapentin. Recheck in 1 month if not improved would consider carpal tunnel injection/nerve hydrodissection.Marland Kitchen  PDMP not reviewed this encounter. Orders Placed This Encounter  Procedures  . [place] LIMITED JOINT SPACE STRUCTURES UP BILAT(NO LINKED CHARGES)    Order Specific Question:   Reason for Exam (SYMPTOM  OR DIAGNOSIS REQUIRED)    Answer:   wrist    Order Specific Question:   Preferred imaging location?    Answer:   [facility] ordered this encounter  Medications  .  gabapentin (NEURONTIN) 300 MG capsule    Sig: Take  1 capsule (300 mg total) by mouth 3 (three) times daily as needed (nerve pain).    Dispense:  90 capsule    Refill:  3    Discussed warning signs or symptoms. Please see discharge instructions. Patient expresses understanding.   The above documentation has been reviewed and is accurate and complete Clementeen Graham, M.D.

## 2020-01-09 NOTE — Patient Instructions (Signed)
Thank you for coming in today.  Please go to [facility] supply to get the wrist brace we talked about today. You may also be able to get it from [employer].   Take the gabapentin at bedtime and up to 3x daily as needed for nerve pain.  Recheck in 1 month.  Let me know sooner if this is not working. Can do an injection sooner.    Carpal Tunnel Syndrome  Carpal tunnel syndrome is a condition that causes pain in your hand and arm. The carpal tunnel is a narrow area that is on the palm side of your wrist. Repeated wrist motion or certain diseases may cause swelling in the tunnel. This swelling can pinch the main nerve in the wrist (median nerve). What are the causes? This condition may be caused by:  Repeated wrist motions.  Wrist injuries.  Arthritis.  A sac of fluid (cyst) or abnormal growth (tumor) in the carpal tunnel.  Fluid buildup during pregnancy. Sometimes the cause is not known. What increases the risk? The following factors may make you more likely to develop this condition:  Having a job in which you move your wrist in the same way many times. This includes jobs like being a Midwife or a Conservation officer, nature.  Being a woman.  Having other health conditions, such as: ? Diabetes. ? Obesity. ? A thyroid gland that is not active enough (hypothyroidism). ? Kidney failure. What are the signs or symptoms? Symptoms of this condition include:  A tingling feeling in your fingers.  Tingling or a loss of feeling (numbness) in your hand.  Pain in your entire arm. This pain may get worse when you bend your wrist and elbow for a long time.  Pain in your wrist that goes up your arm to your shoulder.  Pain that goes down into your palm or fingers.  A weak feeling in your hands. You may find it hard to grab and hold items. You may feel worse at night. How is this diagnosed? This condition is diagnosed with a medical history and physical exam. You may also have tests, such  as:  Electromyogram (EMG). This test checks the signals that the nerves send to the muscles.  Nerve conduction study. This test checks how well signals pass through your nerves.  Imaging tests, such as X-rays, ultrasound, and MRI. These tests check for what might be the cause of your condition. How is this treated? This condition may be treated with:  Lifestyle changes. You will be asked to stop or change the activity that caused your problem.  Doing exercise and activities that make bones and muscles stronger (physical therapy).  Learning how to use your hand again (occupational therapy).  Medicines for pain and swelling (inflammation). You may have injections in your wrist.  A wrist splint.  Surgery. Follow these instructions at home: If you have a splint:  Wear the splint as told by your doctor. Remove it only as told by your doctor.  Loosen the splint if your fingers: ? Tingle. ? Lose feeling (become numb). ? Turn cold and blue.  Keep the splint clean.  If the splint is not waterproof: ? Do not let it get wet. ? Cover it with a watertight covering when you take a bath or a shower. Managing pain, stiffness, and swelling   If told, put ice on the painful area: ? If you have a removable splint, remove it as told by your doctor. ? Put ice in a plastic bag. ?  Place a towel between your skin and the bag. ? Leave the ice on for 20 minutes, 2-3 times per day. General instructions  Take over-the-counter and prescription medicines only as told by your doctor.  Rest your wrist from any activity that may cause pain. If needed, talk with your boss at work about changes that can help your wrist heal.  Do any exercises as told by your doctor, physical therapist, or occupational therapist.  Keep all follow-up visits as told by your doctor. This is important. Contact a doctor if:  You have new symptoms.  Medicine does not help your pain.  Your symptoms get worse. Get  help right away if:  You have very bad numbness or tingling in your wrist or hand. Summary  Carpal tunnel syndrome is a condition that causes pain in your hand and arm.  It is often caused by repeated wrist motions.  Lifestyle changes and medicines are used to treat this problem. Surgery may help in very bad cases.  Follow your doctor's instructions about wearing a splint, resting your wrist, keeping follow-up visits, and calling for help. This information is not intended to replace advice given to you by your health care provider. Make sure you discuss any questions you have with your health care provider. Document Revised: 07/30/2017 Document Reviewed: 07/30/2017 Elsevier Patient Education  2020 [employer].

## 2020-01-15 ENCOUNTER — Encounter: Payer: Self-pay | Admitting: Family Medicine

## 2020-01-15 ENCOUNTER — Ambulatory Visit (INDEPENDENT_AMBULATORY_CARE_PROVIDER_SITE_OTHER): Payer: BLUE CROSS/BLUE SHIELD | Admitting: Family Medicine

## 2020-01-15 ENCOUNTER — Other Ambulatory Visit: Payer: Self-pay

## 2020-01-15 ENCOUNTER — Ambulatory Visit: Payer: Self-pay

## 2020-01-15 VITALS — BP 140/90 | HR 103 | Ht 70.0 in | Wt 196.0 lb

## 2020-01-15 DIAGNOSIS — G5603 Carpal tunnel syndrome, bilateral upper limbs: Secondary | ICD-10-CM

## 2020-01-15 NOTE — Progress Notes (Signed)
Scott Durham is a 19 y.o. male who presents to [facility] today for follow up of bilateral hand numbness. Patient was last seen by Dr. Denyse Amass Patient had rheumatologic work-up that so far has been unrevealing. He has had trials of steroids which have not been helpful. I think it is pretty unlikely that he has a rheumatologic problem but not impossible some of his symptoms today are due to carpal tunnel syndrome. It is also likely that he has some cubital tunnel syndrome as well. Patient was informed to get a brace, given gabapentin, and told to follow up for injections if needed. Patient states that the gabapentin is not helping has been taking it since his last visit. Patient wearing a brace on the right hand and doesn't seem to be helping. Right hand palm side base of thumb is the worst pain. Left hand is a sharp pain sometimes and dull ache all the time. The pain seems to be getting worse not better.   He has initial appointment with rheumatology at the end of the month.   Pertinent review of systems: No fevers or chills  Relevant historical information: Migraine history   Exam:  BP 140/90 (BP Location: Left Arm, Patient Position: Sitting, Cuff Size: Normal)   Pulse (!) 103   Ht 5\' 10"  (1.778 m)   Wt 196 lb (88.9 kg)   SpO2 99%   BMI 28.12 kg/m  General: Well Developed, well nourished, and in no acute distress.   MSK: Bilateral hands and wrist normal-appearing positive Tinel's carpal tunnel    Lab and Radiology Results  Procedure: Real-time Ultrasound Guided hydrodissection of median nerve for left carpal tunnel Device: Philips Affiniti 50G Images permanently stored and available for review in PACS Verbal informed consent obtained.  Discussed risks and benefits of procedure. Warned about infection bleeding damage to structures skin hypopigmentation and fat atrophy among others. Patient expresses understanding and agreement Time-out conducted.     Noted no overlying erythema, induration, or other signs of local infection.   Skin prepped in a sterile fashion.   Local anesthesia: Topical Ethyl chloride.   With sterile technique and under real time ultrasound guidance:  40 mg of Kenalog and 1 mL of lidocaine injected into the carpal tunnel. Fluid seen entering the carpal tunnel around the median nerve.   Completed without difficulty   Pain immediately resolved suggesting accurate placement of the medication.   Advised to call if fevers/chills, erythema, induration, drainage, or persistent bleeding.   Images permanently stored and available for review in the ultrasound unit.  Impression: Technically successful ultrasound guided injection.   Procedure: Real-time Ultrasound Guided I hydrodissection of median nerve and right carpal tunnel Device: Philips Affiniti 50G Images permanently stored and available for review in PACS Verbal informed consent obtained.  Discussed risks and benefits of procedure. Warned about infection bleeding damage to structures skin hypopigmentation and fat atrophy among others. Patient expresses understanding and agreement Time-out conducted.   Noted no overlying erythema, induration, or other signs of local infection.   Skin prepped in a sterile fashion.   Local anesthesia: Topical Ethyl chloride.   With sterile technique and under real time ultrasound guidance:  40 mg of Kenalog and 1 mL of lidocaine injected into the carpal tunnel. Fluid seen entering the carpal tunnel around the median nerve.   Completed without difficulty   Pain immediately resolved suggesting accurate placement of the medication.   Advised to call if fevers/chills, erythema, induration,  drainage, or persistent bleeding.   Images permanently stored and available for review in the ultrasound unit.  Impression: Technically successful ultrasound guided injection.     Assessment and Plan: 19 y.o. male with bilateral carpal tunnel syndrome.   Patient also likely does have possibility of rheumatologic disease.  Plan for bilateral carpal tunnel injections/median nerve hydrodissection today.  Additionally follow-up with rheumatology as scheduled at the end of this month.  Continue night splints and recheck back as needed.   PDMP not reviewed this encounter. Orders Placed This Encounter  Procedures  . [place] LIMITED JOINT SPACE STRUCTURES UP LEFT(NO LINKED CHARGES)    Standing Status:   Future    Number of Occurrences:   1    Standing Expiration Date:   01/14/2021    Order Specific Question:   Reason for Exam (SYMPTOM  OR DIAGNOSIS REQUIRED)    Answer:   Bilateral wrist pain    Order Specific Question:   Preferred imaging location?    Answer:   [facility]   No orders of the defined types were placed in this encounter.    Discussed warning signs or symptoms. Please see discharge instructions. Patient expresses understanding.   The above documentation has been reviewed and is accurate and complete Clementeen Graham, M.D.

## 2020-01-15 NOTE — Patient Instructions (Signed)
Thank you for coming in today.  Call or go to the ER if you develop a large red swollen joint with extreme pain or oozing puss.   Use the splints.   Let me know if this is not working.   If better I will write you to return to work.  If not better let me know.

## 2020-01-22 ENCOUNTER — Telehealth: Payer: Self-pay | Admitting: Family Medicine

## 2020-01-22 ENCOUNTER — Encounter: Payer: Self-pay | Admitting: Neurology

## 2020-01-22 DIAGNOSIS — R200 Anesthesia of skin: Secondary | ICD-10-CM

## 2020-01-22 DIAGNOSIS — G5603 Carpal tunnel syndrome, bilateral upper limbs: Secondary | ICD-10-CM

## 2020-01-22 NOTE — Telephone Encounter (Signed)
Returned [employer] and she informed me that Altonio can't be seen by Neurology until Dec. 7th.  He has Rheumatology on Oct. 27th.  Please advise if you would like to try a different neurology group for pt to possibly be seen sooner.

## 2020-01-22 NOTE — Telephone Encounter (Signed)
Next step from my perspective is a nerve conduction study.  This is typically done at neurology.  I have placed an order now for this to be done however it typically will take a few weeks to get scheduled.  You also has evaluation with rheumatology on the 27th.  Recheck with me following nerve conduction study.  Typically give it about a week after the study to have the results back.

## 2020-01-22 NOTE — Telephone Encounter (Signed)
Called and LM for pt's mother Judeth Cornfield and asked for her to return call to discuss next steps for [facility].  Please advise per Dr. Zollie Pee phone note.

## 2020-01-22 NOTE — Telephone Encounter (Signed)
Patient's mother called stating that he is still having pain and swelling even after the cortisone injection last week. She asked what he should do next?  Please advise.

## 2020-01-23 NOTE — Telephone Encounter (Signed)
I am ordering a special test called nerve conduction study.  Typically it is pretty hard to get that done very quickly.  I am not optimistic about other groups getting you in sooner.

## 2020-01-29 ENCOUNTER — Ambulatory Visit: Payer: BLUE CROSS/BLUE SHIELD | Admitting: Internal Medicine

## 2020-01-31 ENCOUNTER — Ambulatory Visit: Payer: BLUE CROSS/BLUE SHIELD | Admitting: Internal Medicine

## 2020-01-31 NOTE — Progress Notes (Deleted)
Office Visit Note  Patient: Scott Durham             Date of Birth: 2000/05/21           MRN: [id number]             PCP: Marin Olp, MD Referring: Inda Coke, PA Visit Date: 01/31/2020 Occupation: _0 @  Subjective:  No chief complaint on file.   History of Present Illness: Scott Durham is a 19 y.o. male here for evaluation of bilateral hand pain and swelling.***   Labs reviewed 12/2019 RF negative ANA negative ESR normal CRP normal CK normal Uric acid normal  Activities of Daily Living:  Patient reports morning stiffness for *** {minute/hour:19697}.   Patient {ACTIONS;DENIES/REPORTS:21021675::"Denies"} nocturnal pain.  Difficulty dressing/grooming: {ACTIONS;DENIES/REPORTS:21021675::"Denies"} Difficulty climbing stairs: {ACTIONS;DENIES/REPORTS:21021675::"Denies"} Difficulty getting out of chair: {ACTIONS;DENIES/REPORTS:21021675::"Denies"} Difficulty using hands for taps, buttons, cutlery, and/or writing: {ACTIONS;DENIES/REPORTS:21021675::"Denies"}  No Rheumatology ROS completed.   PMFS History:  Patient Active Problem List   Diagnosis Date Noted  . Bilateral carpal tunnel syndrome 01/09/2020  . Migraine   . Allergic rhinitis 07/03/2015  . Acne vulgaris 07/03/2015    Past Medical History:  Diagnosis Date  . Migraine    saw neurology in teenage years- now off all medicines. had been on amitriptyline, sumatriptan, and nauseas medicine     Family History  Problem Relation Age of Onset  . Hiatal hernia Mother   . GER disease Mother   . Other Father        unknown history  . Hyperlipidemia Maternal Grandmother   . Hyperlipidemia Maternal Grandfather   . Aortic aneurysm Paternal Grandmother        had surgery  . Other Paternal Grandfather        back pain issue,   . Cancer - Other Paternal Grandfather    Past Surgical History:  Procedure Laterality Date  . APPENDECTOMY    . CHOLECYSTECTOMY    . TONSILLECTOMY AND ADENOIDECTOMY     Social  History   Social History Narrative   Single- lives with mom.       Planning on [place] and considering university after that. Wants to work on stock/financing.    [employer]th grade in 2021.    Working Visual merchandiser   Prior [employer], Engineer, water      Hobbies: time with friends   Immunization History  Administered Date(s) Administered  . DTaP 05/30/2001, 07/28/2001, 10/13/2001, 02/09/2003, 06/23/2005  . HPV 9-valent 07/09/2014  . HPV Quadrivalent 07/09/2014, 04/19/2015  . Hepatitis B 10/07/00, 04/29/2001, 10/13/2001  . HiB (PRP-OMP) 05/30/2001, 07/28/2001, 10/13/2001, 06/18/2002  . IPV 05/30/2001, 07/28/2001, 08/09/2002, 02/09/2003, 06/23/2005  . Influenza,inj,Quad PF,6+ Mos 12/28/2015, 01/02/2020  . Influenza-Unspecified 12/19/2010, 12/21/2011, 03/22/2013, 03/22/2013, 04/19/2015, 12/28/2015, 12/31/2016  . MMR 06/23/2002, 06/23/2005  . Meningococcal B, OMV 05/02/2019, 07/10/2019  . Meningococcal Conjugate 03/22/2013, 05/02/2019  . Pneumococcal-Unspecified 05/30/2001, 07/28/2001, 10/13/2001, 02/09/2003  . Tdap 08/12/2011, 08/12/2011, 07/04/2018, 02/13/2019  . Varicella 06/23/2002, 06/23/2005     Objective: Vital Signs: There were no vitals taken for this visit.   Physical Exam   Musculoskeletal Exam: ***  CDAI Exam: CDAI Score: -- Patient Global: --; Provider Global: -- Swollen: --; Tender: -- Joint Exam 01/31/2020   No joint exam has been documented for this visit   There is currently no information documented on the homunculus. Go to the Rheumatology activity and complete the homunculus joint exam.  Investigation: No additional findings.  Imaging: DG Hand Complete Left  Result Date: 01/05/2020 CLINICAL DATA:  Pain and swelling for several days EXAM: LEFT HAND - COMPLETE 3+ VIEW COMPARISON:  None. FINDINGS: Frontal, oblique, and lateral views were obtained. No fracture or dislocation. Joint spaces appear normal. No erosive change or periostitis. Bony mineralization is  normal. IMPRESSION: No fracture or dislocation.  No evident arthropathy. Electronically Signed   By: Lowella Grip III M.D.   On: 01/05/2020 14:34   DG Hand Complete Right  Result Date: 01/05/2020 CLINICAL DATA:  Pain and swelling EXAM: RIGHT HAND - COMPLETE 3+ VIEW COMPARISON:  None. FINDINGS: Frontal, oblique, and lateral views were obtained. There is no fracture or dislocation. The joint spaces appear normal. No erosive change or periostitis. Bony mineralization appears normal. IMPRESSION: No fracture or dislocation.  No evident arthropathy. Electronically Signed   By: Lowella Grip III M.D.   On: 01/05/2020 14:35   [place] LIMITED JOINT SPACE STRUCTURES UP BILAT(NO LINKED CHARGES)  Result Date: 01/24/2020 Diagnostic Limited MSK Ultrasound of: Bilateral carpal tunnel Right carpal tunnel: Flattened median nerve with cross-sectional area of 17 mm. Left carpal tunnel flattened median nerve with cross-sectional area of 13 mm. Impression: Mild carpal tunnel syndrome left worse than right   [place] LIMITED JOINT SPACE STRUCTURES UP LEFT(NO LINKED CHARGES)  Result Date: 01/24/2020 Procedure: Real-time Ultrasound Guided hydrodissection of median nerve for left carpal tunnel Device: Philips Affiniti 50G Images permanently stored and available for review in PACS Verbal informed consent obtained. Discussed risks and benefits of procedure. Warned about infection bleeding damage to structures skin hypopigmentation and fat atrophy among others. Patient expresses understanding and agreement Time-out conducted.  Noted no overlying erythema, induration, or other signs of local infection.  Skin prepped in a sterile fashion.  Local anesthesia: Topical Ethyl chloride.  With sterile technique and under real time ultrasound guidance: 40 mg of Kenalog and 1 mL of lidocaine injected into the carpal tunnel. Fluid seen entering the carpal tunnel around the median nerve.  Completed without difficulty  Pain  immediately resolved suggesting accurate placement of the medication.  Advised to call if fevers/chills, erythema, induration, drainage, or persistent bleeding.  Images permanently stored and available for review in the ultrasound unit. Impression: Technically successful ultrasound guided injection.   Procedure: Real-time Ultrasound Guided I hydrodissection of median nerve and right carpal tunnel Device: Philips Affiniti 50G Images permanently stored and available for review in PACS Verbal informed consent obtained. Discussed risks and benefits of procedure. Warned about infection bleeding damage to structures skin hypopigmentation and fat atrophy among others. Patient expresses understanding and agreement Time-out conducted.  Noted no overlying erythema, induration, or other signs of local infection.  Skin prepped in a sterile fashion.  Local anesthesia: Topical Ethyl chloride.  With sterile technique and under real time ultrasound guidance: 40 mg of Kenalog and 1 mL of lidocaine injected into the carpal tunnel. Fluid seen entering the carpal tunnel around the median nerve.  Completed without difficulty  Pain immediately resolved suggesting accurate placement of the medication.  Advised to call if fevers/chills, erythema, induration, drainage, or persistent bleeding.  Images permanently stored and available for review in the ultrasound unit. Impression: Technically successful ultrasound guided injection.   Recent Labs: Lab Results  Component Value Date   WBC 10.0 01/02/2020   HGB 15.6 01/02/2020   PLT 275 01/02/2020   NA 139 01/02/2020   K 4.2 01/02/2020   CL 105 01/02/2020   CO2 27 01/02/2020   GLUCOSE 86 01/02/2020   BUN 13 01/02/2020   CREATININE 0.86 01/02/2020   BILITOT  0.7 01/02/2020   ALKPHOS 76 07/10/2019   AST 17 01/02/2020   ALT 20 01/02/2020   PROT 6.8 01/02/2020   ALBUMIN 4.9 07/10/2019   CALCIUM 9.5 01/02/2020   GFRAA NOT CALCULATED 11/06/2016    Speciality  Comments: No specialty comments available.  Procedures:  No procedures performed Allergies: Penicillins, Benzoyl peroxide, and Adhesive [tape]   Assessment / Plan:     Visit Diagnoses: No diagnosis found.  Orders: No orders of the defined types were placed in this encounter.  No orders of the defined types were placed in this encounter.   Face-to-face time spent with patient was *** minutes. Greater than 50% of time was spent in counseling and coordination of care.  Follow-Up Instructions: No follow-ups on file.   Collier Salina, MD  Note - This record has been created using [employer].  Chart creation errors have been sought, but may not always  have been located. Such creation errors do not reflect on  the standard of medical care.

## 2020-03-05 ENCOUNTER — Encounter: Payer: Self-pay | Admitting: Family Medicine

## 2020-03-05 ENCOUNTER — Encounter: Payer: Self-pay | Admitting: Physician Assistant

## 2020-03-05 ENCOUNTER — Telehealth: Payer: Self-pay

## 2020-03-05 NOTE — Telephone Encounter (Signed)
Ok to write note

## 2020-03-05 NOTE — Telephone Encounter (Signed)
Patients mother called in and the patient needs a note for the days he was seen on 01/02/20 and 01/08/20.

## 2020-03-05 NOTE — Telephone Encounter (Signed)
Yes thanks 

## 2020-03-06 NOTE — Telephone Encounter (Signed)
Letter written, will call pt to make aware.

## 2020-03-06 NOTE — Telephone Encounter (Signed)
Called and lm on moms vm.

## 2020-03-12 ENCOUNTER — Ambulatory Visit (INDEPENDENT_AMBULATORY_CARE_PROVIDER_SITE_OTHER): Payer: BLUE CROSS/BLUE SHIELD | Admitting: Neurology

## 2020-03-12 ENCOUNTER — Other Ambulatory Visit: Payer: Self-pay

## 2020-03-12 DIAGNOSIS — G5603 Carpal tunnel syndrome, bilateral upper limbs: Secondary | ICD-10-CM

## 2020-03-12 DIAGNOSIS — R200 Anesthesia of skin: Secondary | ICD-10-CM

## 2020-03-12 NOTE — Procedures (Signed)
[facility]  [address], [address]  [place], [place] [address] Tel: [phone] Fax:  [phone] Test Date:  03/12/2020  Patient: Scott Durham DOB: January 09, 2001 Physician: Nita Sickle, DO  Sex: Male Height: 5\' 10"  Ref Phys: , MD  ID#: [id number]   Technician:    Patient Complaints: This is a 19 year old man referred for evaluation of bilateral hand pain.  NCV & EMG Findings: Extensive electrodiagnostic testing of the right upper extremity and additional studies of the left shows: 1. Bilateral median, ulnar, and mixed palmar sensory responses are within normal limits. 2. Bilateral median and ulnar motor responses are within normal limits. 3. There is no evidence of active or chronic motor axonal loss changes affecting any of the tested muscles.  Motor unit configuration and recruitment pattern is within normal limits.  Impression: This is a normal study of the upper extremities.  In particular, there is no evidence of carpal tunnel syndrome or a cervical radiculopathy.     ___________________________ 15, DO    Nerve Conduction Studies Anti Sensory Summary Table   Stim Site NR Peak (ms) Norm Peak (ms) P-T Amp (V) Norm P-T Amp  Left Median Anti Sensory (2nd Digit)  35C  Wrist    3.3 <3.3 40.5 >20  Right Median Anti Sensory (2nd Digit)  35C  Wrist    3.0 <3.3 38.2 >20  Left Ulnar Anti Sensory (5th Digit)  35C  Wrist    2.9 <3.0 38.6 >18  Right Ulnar Anti Sensory (5th Digit)  35C  Wrist    2.5 <3.0 39.4 >18   Motor Summary Table   Stim Site NR Onset (ms) Norm Onset (ms) O-P Amp (mV) Norm O-P Amp Site1 Site2 Delta-0 (ms) Dist (cm) Vel (m/s) Norm Vel (m/s)  Left Median Motor (Abd Poll Brev)  35C  Wrist    3.0 <3.9 10.1 >6 Elbow Wrist 5.0 29.0 58 >51  Elbow    8.0  10.1         Right Median Motor (Abd Poll Brev)  35C  Wrist    3.0 <3.9 11.4 >6 Elbow Wrist 5.1 29.0 57 >51  Elbow    8.1  11.2         Left Ulnar Motor (Abd Dig  Minimi)  35C  Wrist    2.8 <3.0 16.5 >8 B Elbow Wrist 4.3 23.0 53 >51  B Elbow    7.1  16.5  A Elbow B Elbow 1.8 10.0 56 >51  A Elbow    8.9  16.0         Right Ulnar Motor (Abd Dig Minimi)  35C  Wrist    2.0 <3.0 16.9 >8 B Elbow Wrist 4.1 23.0 56 >51  B Elbow    6.1  16.3  A Elbow B Elbow 1.9 10.0 53 >51  A Elbow    8.0  16.3          Comparison Summary Table   Stim Site NR Peak (ms) Norm Peak (ms) P-T Amp (V) Site1 Site2 Delta-P (ms) Norm Delta (ms)  Left Median/Ulnar Palm Comparison (Wrist - 8cm)  35C  Median Palm    1.7 <2.2 57.4 Median Palm Ulnar Palm 0.1   Ulnar Palm    1.6 <2.2 31.4      Right Median/Ulnar Palm Comparison (Wrist - 8cm)  35C  Median Palm    1.8 <2.2 61.7 Median Palm Ulnar Palm 0.2   Ulnar Palm    1.6 <2.2 11.5  EMG   Side Muscle Ins Act Fibs Psw Fasc Number Recrt Dur Dur. Amp Amp. Poly Poly. Comment  Right 1stDorInt Nml Nml Nml Nml Nml Nml Nml Nml Nml Nml Nml Nml N/A  Right Abd Poll Brev Nml Nml Nml Nml Nml Nml Nml Nml Nml Nml Nml Nml N/A  Right Biceps Nml Nml Nml Nml Nml Nml Nml Nml Nml Nml Nml Nml N/A  Right PronatorTeres Nml Nml Nml Nml Nml Nml Nml Nml Nml Nml Nml Nml N/A  Right Triceps Nml Nml Nml Nml Nml Nml Nml Nml Nml Nml Nml Nml N/A  Right Deltoid Nml Nml Nml Nml Nml Nml Nml Nml Nml Nml Nml Nml N/A  Left 1stDorInt Nml Nml Nml Nml Nml Nml Nml Nml Nml Nml Nml Nml N/A  Left Abd Poll Brev Nml Nml Nml Nml Nml Nml Nml Nml Nml Nml Nml Nml N/A  Left PronatorTeres Nml Nml Nml Nml Nml Nml Nml Nml Nml Nml Nml Nml N/A  Left Biceps Nml Nml Nml Nml Nml Nml Nml Nml Nml Nml Nml Nml N/A  Left Triceps Nml Nml Nml Nml Nml Nml Nml Nml Nml Nml Nml Nml N/A  Left Deltoid Nml Nml Nml Nml Nml Nml Nml Nml Nml Nml Nml Nml N/A      Waveforms:

## 2020-03-12 NOTE — Progress Notes (Signed)
Nerve conduction study was normal.  Based on this recommend rheumatology evaluation.  Has this been completed?

## 2020-03-18 ENCOUNTER — Other Ambulatory Visit: Payer: Self-pay

## 2020-03-18 DIAGNOSIS — M7989 Other specified soft tissue disorders: Secondary | ICD-10-CM

## 2020-03-19 ENCOUNTER — Encounter: Payer: Self-pay | Admitting: Family Medicine

## 2020-03-19 NOTE — Telephone Encounter (Signed)
Insurance has been scanned into chart.

## 2020-03-21 LAB — CBC AND DIFFERENTIAL
HCT: 45 (ref 41–53)
Hemoglobin: 15.8 (ref 13.5–17.5)
Neutrophils Absolute: 58.1
Platelets: 259 (ref 150–399)
WBC: 9

## 2020-03-21 LAB — BASIC METABOLIC PANEL
BUN: 11 (ref 4–21)
CO2: 28 — AB (ref 13–22)
Chloride: 103 (ref 99–108)
Creatinine: 0.9 (ref 0.6–1.3)
Glucose: 88
Potassium: 4.4 (ref 3.4–5.3)
Sodium: 142 (ref 137–147)

## 2020-03-21 LAB — COMPREHENSIVE METABOLIC PANEL
Albumin: 4.8 (ref 3.5–5.0)
Calcium: 9.6 (ref 8.7–10.7)
GFR calc Af Amer: 105
GFR calc non Af Amer: 91
Globulin: 2.2

## 2020-03-21 LAB — HEPATIC FUNCTION PANEL
ALT: 28 (ref 3–30)
AST: 16 (ref 2–40)
Alkaline Phosphatase: 77 (ref 25–125)
Bilirubin, Total: 0.7

## 2020-03-21 LAB — CBC: RBC: 5.34 — AB (ref 3.87–5.11)

## 2020-03-26 ENCOUNTER — Other Ambulatory Visit (HOSPITAL_BASED_OUTPATIENT_CLINIC_OR_DEPARTMENT_OTHER): Payer: Self-pay | Admitting: Rheumatology

## 2020-03-26 MED FILL — predniSONE 5 MG TABS: 5 | 18 days supply | Qty: 63 | Fill #0

## 2020-04-04 ENCOUNTER — Other Ambulatory Visit (HOSPITAL_BASED_OUTPATIENT_CLINIC_OR_DEPARTMENT_OTHER): Payer: Self-pay | Admitting: Rheumatology

## 2020-04-04 MED FILL — NAPROXEN 500 MG TABS: 500 | 46 days supply | Qty: 60 | Fill #0

## 2020-04-08 ENCOUNTER — Telehealth (INDEPENDENT_AMBULATORY_CARE_PROVIDER_SITE_OTHER): Payer: No Typology Code available for payment source | Admitting: Family Medicine

## 2020-04-08 ENCOUNTER — Encounter: Payer: Self-pay | Admitting: Family Medicine

## 2020-04-08 VITALS — Temp 100.0°F | Ht 70.0 in | Wt 201.0 lb

## 2020-04-08 DIAGNOSIS — J329 Chronic sinusitis, unspecified: Secondary | ICD-10-CM

## 2020-04-08 NOTE — Progress Notes (Signed)
   Scott Durham is a 20 y.o. male who presents today for a telephone visit.  Assessment/Plan:  Sinusitis No red flags.  Covid test negative.  Will start Astelin nasal spray.  Can continue over-the-counter meds as well.  Will give pocket prescription of azithromycin with instruction not start with symptoms worsen or do not improve.  Discussed reasons to return to care.  Follow-up as needed.    Subjective:  HPI:  Symptoms started 2 days ago with fever and chills. This morning has had more cough and sore throat. Tried taking zinc. Burgess Estelle was around someone that had covid. His covid test was negative. No sinus congestion pressure        Objective/Observations   NAD  Telephone Visit   I connected with Scott Durham on 04/08/20 at 11:40 AM EST via telephone and verified that I am speaking with the correct person using two identifiers. I discussed the limitations of evaluation and management by telemedicine and the availability of in person appointments. The patient expressed understanding and agreed to proceed.   Patient location: Home Provider location: [place] [facility] [employer] Persons participating in the virtual visit: Myself and patient  A total of 6 minutes were spent on medical discussion.      Katina Degree. Jimmey Ralph, MD 04/08/2020 11:29 AM

## 2020-04-10 ENCOUNTER — Ambulatory Visit (INDEPENDENT_AMBULATORY_CARE_PROVIDER_SITE_OTHER): Payer: 59 | Admitting: *Deleted

## 2020-04-10 ENCOUNTER — Other Ambulatory Visit: Payer: Self-pay | Admitting: Physician Assistant

## 2020-04-10 DIAGNOSIS — J329 Chronic sinusitis, unspecified: Secondary | ICD-10-CM | POA: Diagnosis not present

## 2020-04-10 LAB — POCT RAPID STREP A (OFFICE): Rapid Strep A Screen: POSITIVE — AB

## 2020-04-10 LAB — POCT INFLUENZA A/B
Influenza A, POC: NEGATIVE
Influenza B, POC: NEGATIVE

## 2020-04-10 MED ORDER — AZITHROMYCIN 250 MG PO TABS: ORAL_TABLET | ORAL | 0 refills | Status: DC

## 2020-04-10 MED FILL — AZITHROMYCIN 250 MG TABLET: 250 | 5 days supply | Qty: 6 | Fill #0

## 2020-04-10 NOTE — Progress Notes (Addendum)
Pt present for flu a/b, strep and Covid test   Flu test positive. I have sent in oral azithromycin to his pharmacy, as he has PCN allergy.  Jarold Motto PA-C

## 2020-04-12 LAB — NOVEL CORONAVIRUS, NAA: SARS-CoV-2, NAA: NOT DETECTED

## 2020-04-12 LAB — SARS-COV-2, NAA 2 DAY TAT

## 2020-04-26 ENCOUNTER — Other Ambulatory Visit: Payer: Self-pay | Admitting: Rheumatology

## 2020-04-26 DIAGNOSIS — M549 Dorsalgia, unspecified: Secondary | ICD-10-CM

## 2020-05-08 ENCOUNTER — Ambulatory Visit
Admission: EM | Admit: 2020-05-08 | Discharge: 2020-05-08 | Disposition: A | Discharge status: Home / Self Care | Payer: 59 | Attending: Urgent Care | Admitting: Urgent Care

## 2020-05-08 ENCOUNTER — Other Ambulatory Visit: Payer: Self-pay | Admitting: Urgent Care

## 2020-05-08 ENCOUNTER — Other Ambulatory Visit: Payer: Self-pay

## 2020-05-08 ENCOUNTER — Telehealth: Payer: 59 | Admitting: Physician Assistant

## 2020-05-08 ENCOUNTER — Encounter: Payer: Self-pay | Admitting: Physician Assistant

## 2020-05-08 VITALS — Temp 99.0°F | Ht 70.0 in | Wt 200.0 lb

## 2020-05-08 DIAGNOSIS — B349 Viral infection, unspecified: Secondary | ICD-10-CM | POA: Diagnosis not present

## 2020-05-08 DIAGNOSIS — Z1152 Encounter for screening for COVID-19: Secondary | ICD-10-CM

## 2020-05-08 DIAGNOSIS — R059 Cough, unspecified: Secondary | ICD-10-CM

## 2020-05-08 DIAGNOSIS — R0789 Other chest pain: Secondary | ICD-10-CM

## 2020-05-08 DIAGNOSIS — R079 Chest pain, unspecified: Secondary | ICD-10-CM

## 2020-05-08 MED ORDER — PSEUDOEPHEDRINE HCL 60 MG PO TABS: 60.0000 mg | ORAL_TABLET | Freq: Three times a day (TID) | ORAL | 0 refills | Status: DC | PRN

## 2020-05-08 MED ORDER — PROMETHAZINE-DM 6.25-15 MG/5ML PO SYRP: 5.0000 mL | ORAL_SOLUTION | Freq: Every evening | ORAL | 0 refills | Status: DC | PRN

## 2020-05-08 MED ORDER — CETIRIZINE HCL 10 MG PO TABS: 10.0000 mg | ORAL_TABLET | Freq: Every day | ORAL | 0 refills | Status: DC

## 2020-05-08 MED ORDER — BENZONATATE 100 MG PO CAPS: 100.0000 mg | ORAL_CAPSULE | Freq: Three times a day (TID) | ORAL | 0 refills | Status: DC | PRN

## 2020-05-08 MED FILL — SUDOGEST PSE 60 MG TABLET: 60 | 10 days supply | Qty: 30 | Fill #0

## 2020-05-08 MED FILL — PROMETHAZINE W/DM SYRUP: 6.25-15 | 20 days supply | Qty: 100 | Fill #0

## 2020-05-08 MED FILL — CETIRIZINE HCL 10 MG TABS: 10 | 100 days supply | Qty: 100 | Fill #0

## 2020-05-08 MED FILL — BENZONATATE 100 MG CAPS: 100 | 10 days supply | Qty: 60 | Fill #0

## 2020-05-08 NOTE — Progress Notes (Signed)
After brief discussion with patient I felt that he would best be served at urgent care setting.  Worsening chest pain, currently 5/10, dull. Fever up to 101. Nausea, SOB, HA and fatigue.  Encouraged patient to go to [facility] ASAP. Worsening symptoms warrant ER evaluation, he verbalized understanding.  He is agreeable.  No charge for my visit today.  Jarold Motto PA-C

## 2020-05-08 NOTE — Discharge Instructions (Addendum)

## 2020-05-08 NOTE — ED Provider Notes (Signed)
[facility]   MRN: [id number] DOB: 11-05-2000  Subjective:   Scott Durham is a 20 y.o. male presenting for acute onset in the past day of lower chest pain, malaise, cough. Patient is COVID vaccinated.  Has had multiple exposures at work.  Denies history of heart conditions, arrhythmias, asthma.  Patient is not a smoker, no marijuana or drug use.  Denies active chest pain, shortness of breath.  Has not taken medications for relief.  No current facility-administered medications for this encounter.  Current Outpatient Medications:  .  naproxen (NAPROSYN) 500 MG tablet, Take 500 mg by mouth daily in the afternoon., Disp: , Rfl:  .  diclofenac Sodium (VOLTAREN) 1 % GEL, Apply 4 g topically 4 (four) times daily., Disp: 50 g, Rfl: 3   Allergies  Allergen Reactions  . Penicillins Rash  . Benzoyl Peroxide     hives  . Adhesive [Tape] Rash    Past Medical History:  Diagnosis Date  . Migraine    saw neurology in teenage years- now off all medicines. had been on amitriptyline, sumatriptan, and nauseas medicine      Past Surgical History:  Procedure Laterality Date  . APPENDECTOMY    . CHOLECYSTECTOMY    . TONSILLECTOMY AND ADENOIDECTOMY      Family History  Problem Relation Age of Onset  . Hiatal hernia Mother   . GER disease Mother   . Other Father        unknown history  . Hyperlipidemia Maternal Grandmother   . Hyperlipidemia Maternal Grandfather   . Aortic aneurysm Paternal Grandmother        had surgery  . Other Paternal Grandfather        back pain issue,   . Cancer - Other Paternal Grandfather     Social History   Tobacco Use  . Smoking status: Never Smoker  . Smokeless tobacco: Never Used  Vaping Use  . Vaping Use: Never used  Substance Use Topics  . Alcohol use: No  . Drug use: Yes    Types: Marijuana    ROS   Objective:   Vitals: BP 119/73 (BP Location: Right Arm)   Temp 98 F (36.7 C) (Oral)   Resp 18   SpO2 98%   Physical  Exam Constitutional:      General: He is not in acute distress.    Appearance: Normal appearance. He is well-developed. He is not ill-appearing, toxic-appearing or diaphoretic.  HENT:     Head: Normocephalic and atraumatic.     Right Ear: External ear normal.     Left Ear: External ear normal.     Nose: Nose normal.     Mouth/Throat:     Mouth: Mucous membranes are moist.     Pharynx: Oropharynx is clear.  Eyes:     General: No scleral icterus.    Extraocular Movements: Extraocular movements intact.     Pupils: Pupils are equal, round, and reactive to light.  Cardiovascular:     Rate and Rhythm: Normal rate and regular rhythm.     Heart sounds: Normal heart sounds. No murmur heard. No friction rub. No gallop.   Pulmonary:     Effort: Pulmonary effort is normal. No respiratory distress.     Breath sounds: Normal breath sounds. No stridor. No wheezing, rhonchi or rales.  Neurological:     Mental Status: He is alert and oriented to person, place, and time.  Psychiatric:        Mood and Affect:  Mood normal.        Behavior: Behavior normal.        Thought Content: Thought content normal.     ED ECG REPORT   Date: 05/08/2020  EKG Time: 12:59 PM  Rate: 74 bpm  Rhythm: normal sinus rhythm,  normal EKG, normal sinus rhythm, incomplete right bundle branch block  Axis: Right  Narrative Interpretation: Sinus rhythm with right axis deviation, incomplete right bundle branch block, 74 bpm.  No acute findings.    Assessment and Plan :   PDMP not reviewed this encounter.  1. Viral syndrome   2. Encounter for screening for COVID-19   3. Cough   4. Atypical chest pain     Patient has clear cardiopulmonary exam, is currently asymptomatic.  Reassuring EKG is there are no signs of pericarditis, acute processes.  Recommended follow-up with his PCP, consideration for referral to cardiologist.  COVID-19 testing pending.  Use supportive care otherwise.  Counseled patient on potential for  adverse effects with medications prescribed/recommended today, ER and return-to-clinic precautions discussed, patient verbalized understanding.    Wallis Bamberg, PA-C 05/08/20 1324

## 2020-05-09 LAB — SARS-COV-2, NAA 2 DAY TAT

## 2020-05-09 LAB — NOVEL CORONAVIRUS, NAA: SARS-CoV-2, NAA: NOT DETECTED

## 2020-05-13 ENCOUNTER — Other Ambulatory Visit: Payer: Self-pay

## 2020-05-13 ENCOUNTER — Ambulatory Visit
Admission: RE | Admit: 2020-05-13 | Discharge: 2020-05-13 | Disposition: A | Discharge status: Home / Self Care | Payer: 59 | Source: Ambulatory Visit | Attending: Rheumatology | Admitting: Rheumatology

## 2020-05-13 DIAGNOSIS — M549 Dorsalgia, unspecified: Secondary | ICD-10-CM

## 2020-05-13 DIAGNOSIS — M79604 Pain in right leg: Secondary | ICD-10-CM | POA: Diagnosis not present

## 2020-05-13 DIAGNOSIS — M79605 Pain in left leg: Secondary | ICD-10-CM | POA: Diagnosis not present

## 2020-05-13 DIAGNOSIS — M545 Low back pain, unspecified: Secondary | ICD-10-CM | POA: Diagnosis not present

## 2020-05-13 IMAGING — MR MR SACRUM / SI JOINTS WO CM
4 of 5 series · 26 of 48 positions shown · non-contrast
Comparison: Sacroiliac joint x-rays dated [DATE].

CLINICAL DATA: Low back and bilateral leg pain for the past 4
months. No injury or prior surgery.

EXAM:
MRI SACRUM WITHOUT CONTRAST
TECHNIQUE: Multiplanar, multisequence MR imaging of the sacrum was performed.
No intravenous contrast was administered.

[Series 4: T2 fat-sat · sagittal · 4.0mm · 0.94mm/px · 9 of 49 slices shown (1 of 2)]
[im 1/49]
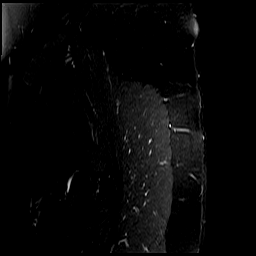
[im 7/49]
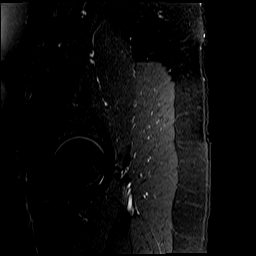
[im 13/49]
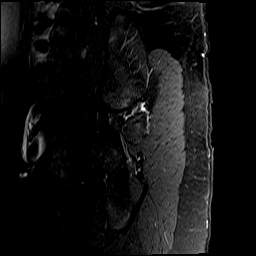
[im 19/49]
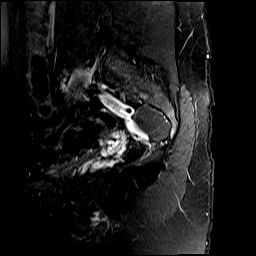
[im 25/49]
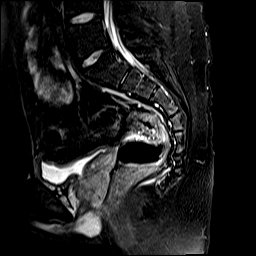
[im 31/49]
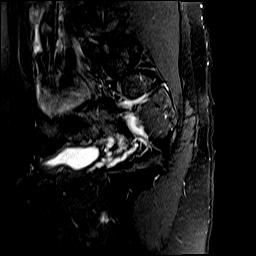
[im 37/49]
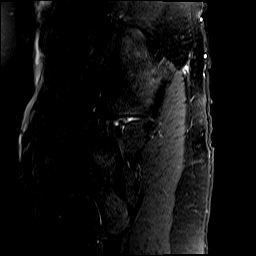
[im 43/49]
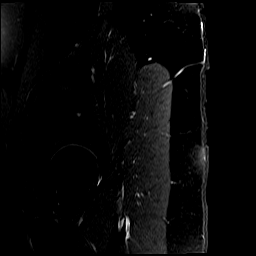
[im 49/49]
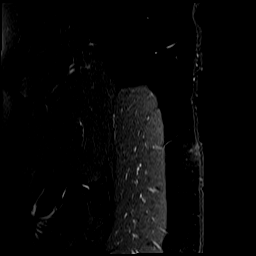

[Series 5: T1 · oblique · 3.0mm · 0.55mm/px · 8 of 52 slices shown]
[im 1/52]
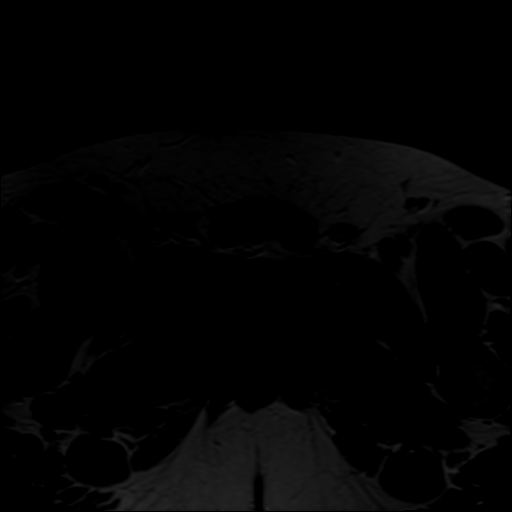
[im 6/52]
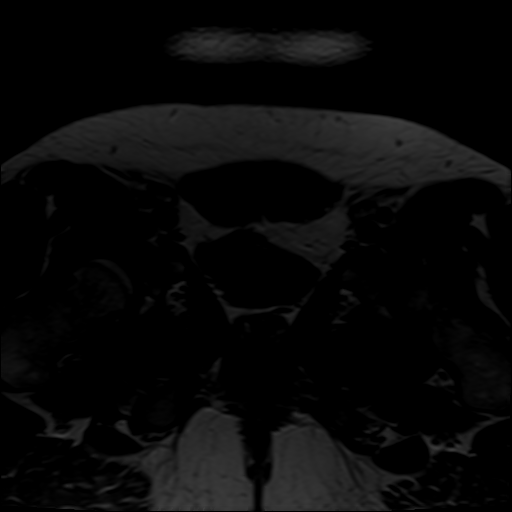
[im 18/52]
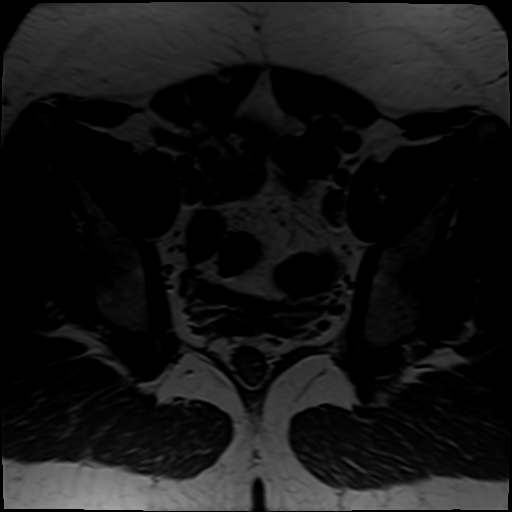
[im 23/52]
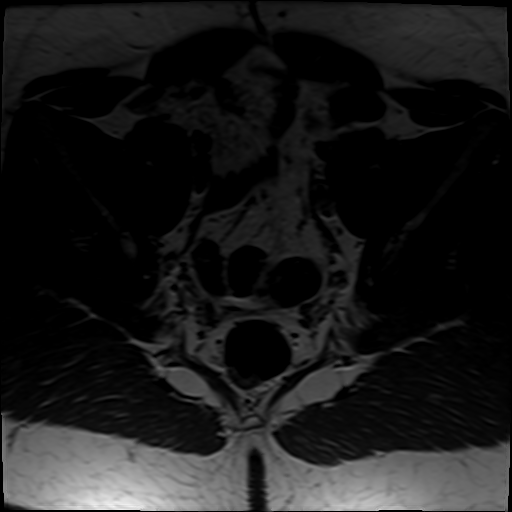
[im 29/52]
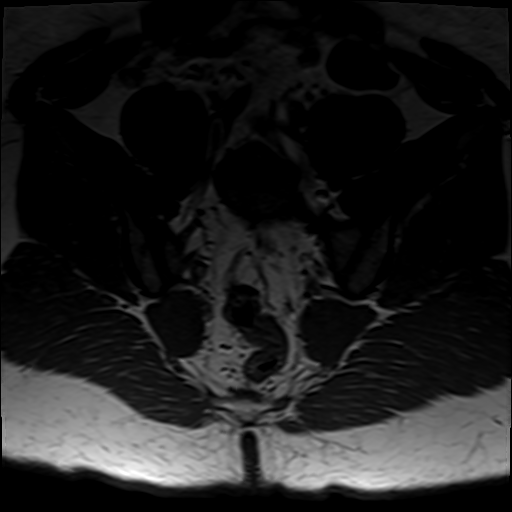
[im 35/52]
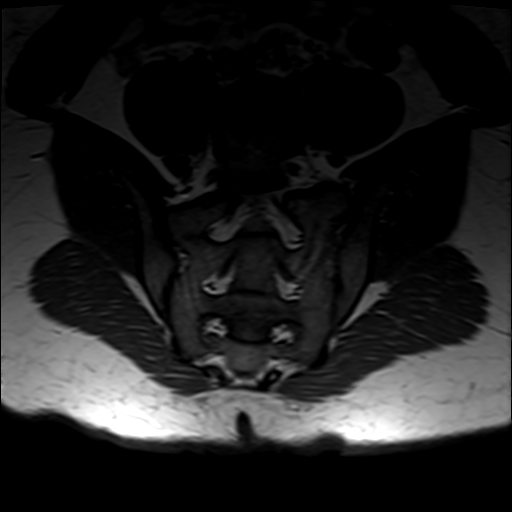
[im 46/52]
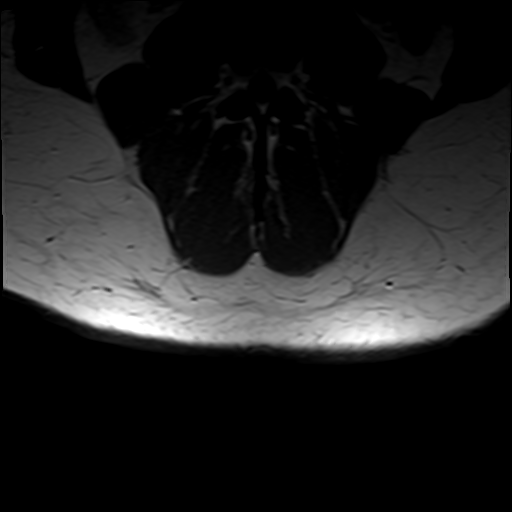
[im 52/52]
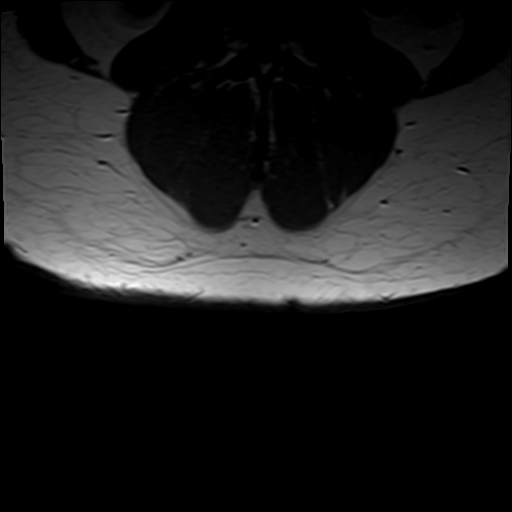

[Series 6: STIR · oblique · 3.0mm · 0.55mm/px · 6 of 50 slices shown]
[im 1/50]
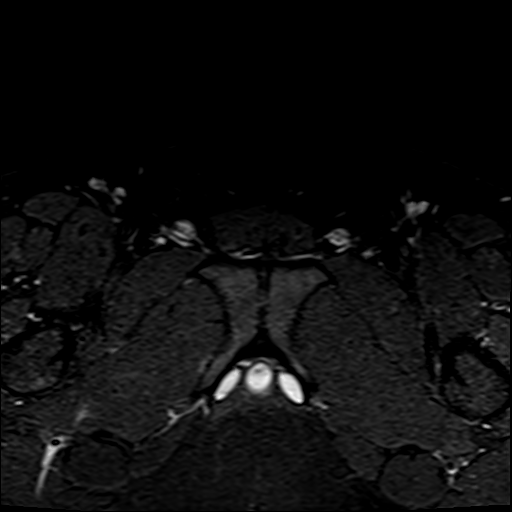
[im 7/50]
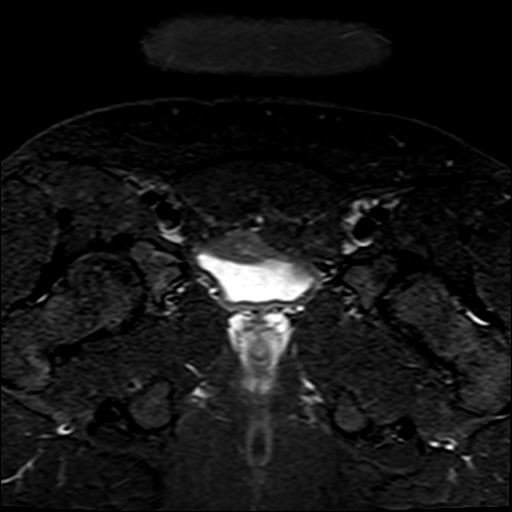
[im 13/50]
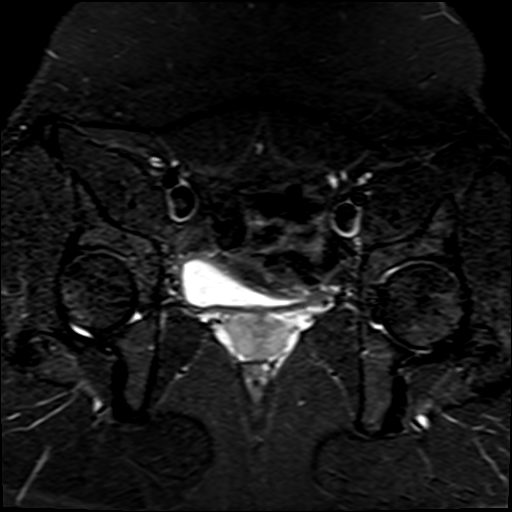
[im 19/50]
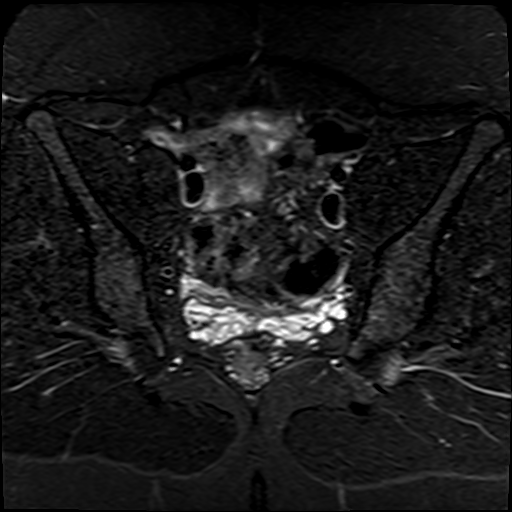
[im 25/50]
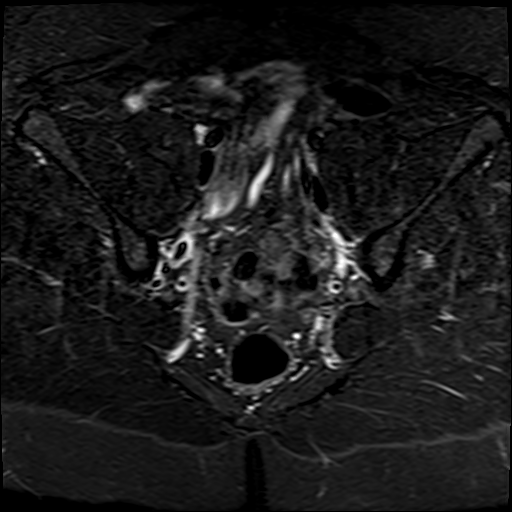
[im 43/50]
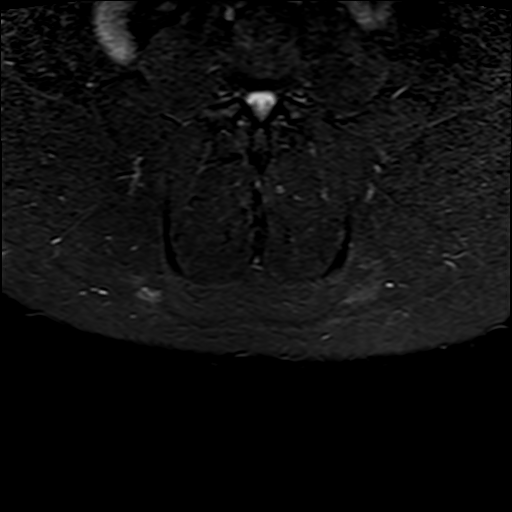

[Series 7: T2 fat-sat · axial · 4.0mm · 0.59mm/px · z∈[-123,+87]mm · 3 of 54 slices shown (2 of 2)]
[im 6/54]
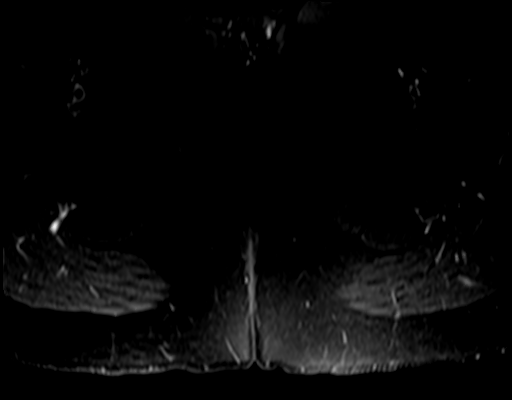
[im 30/54]
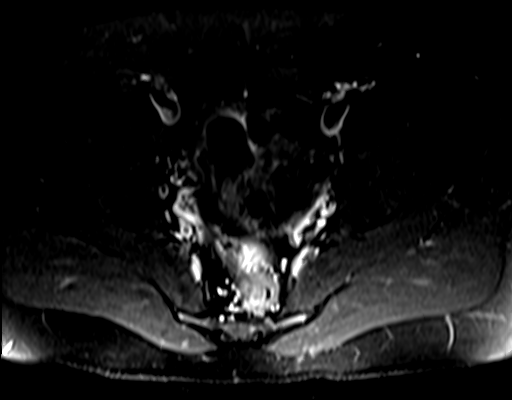
[im 48/54]
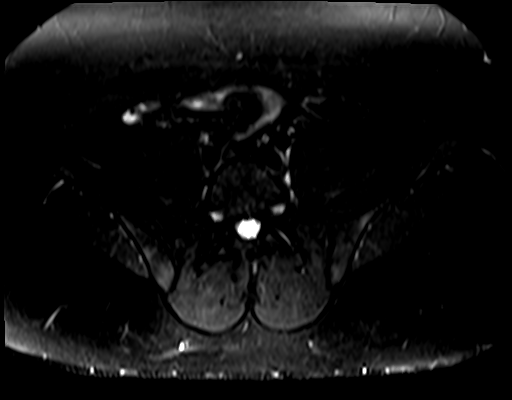

[26 of 48 positions shown; findings below may reference images not displayed]

FINDINGS: Bones/Joint/Cartilage

No marrow signal abnormality. The sacroiliac joints are
unremarkable. No fracture or dislocation. No joint effusion. The
visualized lower lumbar spine is unremarkable.

Muscles and Tendons
Intact.  No muscle edema or atrophy.

Soft tissue
No fluid collection or hematoma. No soft tissue mass. Visualized
intrapelvic contents are unremarkable.
IMPRESSION: 1. Normal MRI of the sacrum.

## 2020-05-31 ENCOUNTER — Emergency Department (HOSPITAL_BASED_OUTPATIENT_CLINIC_OR_DEPARTMENT_OTHER): Payer: 59

## 2020-05-31 ENCOUNTER — Other Ambulatory Visit: Payer: Self-pay

## 2020-05-31 ENCOUNTER — Encounter (HOSPITAL_BASED_OUTPATIENT_CLINIC_OR_DEPARTMENT_OTHER): Payer: Self-pay | Admitting: *Deleted

## 2020-05-31 ENCOUNTER — Emergency Department (HOSPITAL_BASED_OUTPATIENT_CLINIC_OR_DEPARTMENT_OTHER)
Admission: EM | Admit: 2020-05-31 | Discharge: 2020-05-31 | Disposition: A | Discharge status: Home / Self Care | Payer: 59 | Attending: Emergency Medicine | Admitting: Emergency Medicine

## 2020-05-31 DIAGNOSIS — R0789 Other chest pain: Secondary | ICD-10-CM | POA: Diagnosis not present

## 2020-05-31 DIAGNOSIS — R5383 Other fatigue: Secondary | ICD-10-CM | POA: Diagnosis not present

## 2020-05-31 DIAGNOSIS — R0602 Shortness of breath: Secondary | ICD-10-CM | POA: Diagnosis not present

## 2020-05-31 DIAGNOSIS — R079 Chest pain, unspecified: Secondary | ICD-10-CM | POA: Insufficient documentation

## 2020-05-31 LAB — CBC WITH DIFFERENTIAL/PLATELET
Abs Immature Granulocytes: 0.01 10*3/uL (ref 0.00–0.07)
Basophils Absolute: 0.1 10*3/uL (ref 0.0–0.1)
Basophils Relative: 1 %
Eosinophils Absolute: 0.1 10*3/uL (ref 0.0–0.5)
Eosinophils Relative: 1 %
HCT: 46.9 % (ref 39.0–52.0)
Hemoglobin: 16.5 g/dL (ref 13.0–17.0)
Immature Granulocytes: 0 %
Lymphocytes Relative: 35 %
Lymphs Abs: 3 10*3/uL (ref 0.7–4.0)
MCH: 30.1 pg (ref 26.0–34.0)
MCHC: 35.2 g/dL (ref 30.0–36.0)
MCV: 85.6 fL (ref 80.0–100.0)
Monocytes Absolute: 0.8 10*3/uL (ref 0.1–1.0)
Monocytes Relative: 9 %
Neutro Abs: 4.6 10*3/uL (ref 1.7–7.7)
Neutrophils Relative %: 54 %
Platelets: 266 10*3/uL (ref 150–400)
RBC: 5.48 MIL/uL (ref 4.22–5.81)
RDW: 12.1 % (ref 11.5–15.5)
WBC: 8.5 10*3/uL (ref 4.0–10.5)
nRBC: 0 % (ref 0.0–0.2)

## 2020-05-31 LAB — COMPREHENSIVE METABOLIC PANEL
ALT: 44 U/L (ref 0–44)
AST: 27 U/L (ref 15–41)
Albumin: 4.6 g/dL (ref 3.5–5.0)
Alkaline Phosphatase: 67 U/L (ref 38–126)
Anion gap: 9 (ref 5–15)
BUN: 13 mg/dL (ref 6–20)
CO2: 27 mmol/L (ref 22–32)
Calcium: 9.4 mg/dL (ref 8.9–10.3)
Chloride: 103 mmol/L (ref 98–111)
Creatinine, Ser: 0.88 mg/dL (ref 0.61–1.24)
GFR, Estimated: >60 mL/min (ref >60)
Glucose, Bld: 91 mg/dL (ref 70–99)
Potassium: 3.6 mmol/L (ref 3.5–5.1)
Sodium: 139 mmol/L (ref 135–145)
Total Bilirubin: 0.3 mg/dL (ref 0.3–1.2)
Total Protein: 7.4 g/dL (ref 6.5–8.1)

## 2020-05-31 LAB — TROPONIN I (HIGH SENSITIVITY): Troponin I (High Sensitivity): <2 ng/L (ref <18)

## 2020-05-31 IMAGING — DX DG CHEST 1V PORT
1 series · 1 of 1 positions shown · non-contrast
Comparison: None.

CLINICAL DATA: Chest pain

EXAM:
PORTABLE CHEST 1 VIEW

[chest ap]
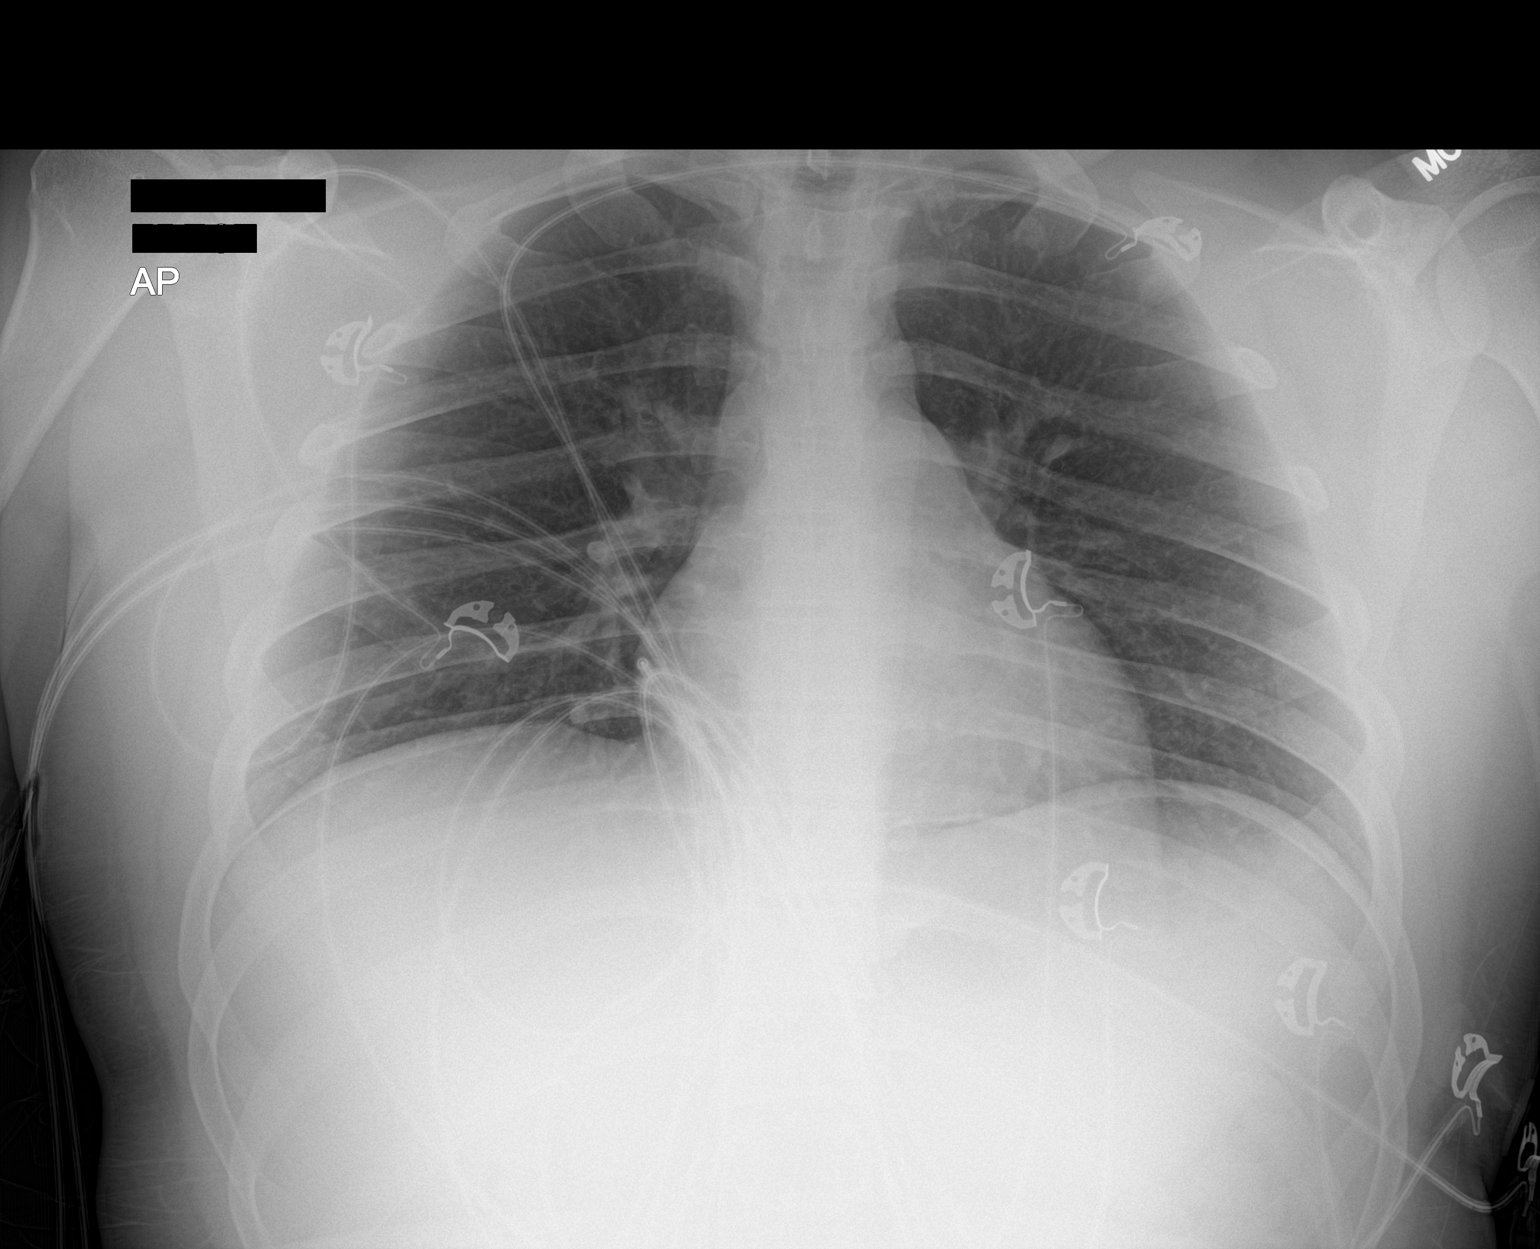

[1 of 1 positions shown; findings below may reference images not displayed]

FINDINGS: The heart size and mediastinal contours are within normal limits.
Both lungs are clear. The visualized skeletal structures are
unremarkable.
IMPRESSION: No active disease.

## 2020-05-31 NOTE — ED Provider Notes (Signed)
[facility] EMERGENCY DEPARTMENT Provider Note   CSN: [id number] Arrival date & time: 05/31/20  1947     History Chief Complaint  Patient presents with  . Chest Pain    Scott Durham is a 20 y.o. male.  He is here with a complaint of right-sided chest pain that is been going on for a week getting progressively worse.  Worsened tonight at dinner.  He said he went to urgent care and they did not give him a diagnosis.  He sometimes feels short of breath.  He sometimes has abdominal pain.  No trauma noted.  No fevers or chills.  He vapes sometimes but denies cocaine.  The history is provided by the patient.  Chest Pain Pain location:  R chest Pain quality: sharp   Pain radiates to:  Does not radiate Pain severity now: 7/10. Onset quality:  Gradual Timing:  Intermittent Progression:  Worsening Chronicity:  New Context: not trauma   Relieved by:  Nothing Worsened by:  Movement Ineffective treatments: ibuprofen. Associated symptoms: fatigue and shortness of breath   Associated symptoms: no cough, no diaphoresis, no fever, no headache, no nausea and no vomiting        Past Medical History:  Diagnosis Date  . Migraine    saw neurology in teenage years- now off all medicines. had been on amitriptyline, sumatriptan, and nauseas medicine     Patient Active Problem List   Diagnosis Date Noted  . Bilateral carpal tunnel syndrome 01/09/2020  . Migraine   . Allergic rhinitis 07/03/2015  . Acne vulgaris 07/03/2015    Past Surgical History:  Procedure Laterality Date  . APPENDECTOMY    . CHOLECYSTECTOMY    . TONSILLECTOMY AND ADENOIDECTOMY         Family History  Problem Relation Age of Onset  . Hiatal hernia Mother   . GER disease Mother   . Other Father        unknown history  . Hyperlipidemia Maternal Grandmother   . Hyperlipidemia Maternal Grandfather   . Aortic aneurysm Paternal Grandmother        had surgery  . Other Paternal Grandfather        back  pain issue,   . Cancer - Other Paternal Grandfather     Social History   Tobacco Use  . Smoking status: Never Smoker  . Smokeless tobacco: Never Used  Vaping Use  . Vaping Use: Never used  Substance Use Topics  . Alcohol use: No  . Drug use: Yes    Types: Marijuana    Home Medications Prior to Admission medications   Medication Sig Start Date End Date Taking? Authorizing Provider  benzonatate (TESSALON) 100 MG capsule Take 1-2 capsules (100-200 mg total) by mouth 3 (three) times daily as needed for cough. 05/08/20   Wallis Bamberg, PA-C  cetirizine (ZYRTEC ALLERGY) 10 MG tablet Take 1 tablet (10 mg total) by mouth daily. 05/08/20   Wallis Bamberg, PA-C  diclofenac Sodium (VOLTAREN) 1 % GEL Apply 4 g topically 4 (four) times daily. 01/08/20   Jarold Motto, PA  naproxen (NAPROSYN) 500 MG tablet Take 500 mg by mouth daily in the afternoon. 04/04/20   [provider]  promethazine-dextromethorphan (PROMETHAZINE-DM) 6.25-15 MG/5ML syrup Take 5 mLs by mouth at bedtime as needed for cough. 05/08/20   Wallis Bamberg, PA-C  pseudoephedrine (SUDAFED) 60 MG tablet Take 1 tablet (60 mg total) by mouth every 8 (eight) hours as needed for congestion. 05/08/20   Wallis Bamberg, PA-C  promethazine (PHENERGAN) 25 MG tablet Take 25 mg by mouth every 6 (six) hours as needed for nausea or vomiting.  12/31/19  [provider]    Allergies    Penicillins, Benzoyl peroxide, and Adhesive [tape]  Review of Systems   Review of Systems  Constitutional: Positive for fatigue. Negative for diaphoresis and fever.  HENT: Negative for sore throat.   Eyes: Negative for visual disturbance.  Respiratory: Positive for shortness of breath. Negative for cough.   Cardiovascular: Positive for chest pain.  Gastrointestinal: Negative for nausea and vomiting.  Genitourinary: Negative for dysuria.  Musculoskeletal: Negative for neck pain.  Skin: Negative for rash.  Neurological: Negative for headaches.    Physical  Exam Updated Vital Signs BP (!) 141/98 (BP Location: Left Arm)   Pulse 93   Temp 98.3 F (36.8 C) (Oral)   Resp 18   Ht 5\' 11"  (1.803 m)   Wt 91.2 kg   SpO2 100%   BMI 28.03 kg/m   Physical Exam Vitals and nursing note reviewed.  Constitutional:      Appearance: He is well-developed and well-nourished.  HENT:     Head: Normocephalic and atraumatic.  Eyes:     Conjunctiva/sclera: Conjunctivae normal.  Cardiovascular:     Rate and Rhythm: Normal rate and regular rhythm.     Heart sounds: Normal heart sounds. No murmur heard.   Pulmonary:     Effort: Pulmonary effort is normal. No respiratory distress.     Breath sounds: Normal breath sounds.  Chest:     Chest wall: Tenderness present. No crepitus.  Abdominal:     Palpations: Abdomen is soft.     Tenderness: There is no abdominal tenderness.  Musculoskeletal:        General: No edema. Normal range of motion.     Cervical back: Neck supple.     Right lower leg: No edema.     Left lower leg: No edema.  Skin:    General: Skin is warm and dry.     Capillary Refill: Capillary refill takes less than 2 seconds.  Neurological:     General: No focal deficit present.     Mental Status: He is alert.  Psychiatric:        Mood and Affect: Mood and affect normal.     ED Results / Procedures / Treatments   Labs (all labs ordered are listed, but only abnormal results are displayed) Labs Reviewed  COMPREHENSIVE METABOLIC PANEL  CBC WITH DIFFERENTIAL/PLATELET  TROPONIN I (HIGH SENSITIVITY)    EKG EKG Interpretation  Date/Time:  Friday May 31 2020 20:25:34 EST Ventricular Rate:  88 PR Interval:    QRS Duration: 117 QT Interval:  354 QTC Calculation: 429 R Axis:   172 Text Interpretation: Sinus rhythm Nonspecific intraventricular conduction delay No significant change since last tracing Confirmed by 09-20-2004 [phone]) on 05/31/2020 8:28:29 PM   Radiology DG Chest Port 1 View  Result Date:  05/31/2020 CLINICAL DATA:  Chest pain EXAM: PORTABLE CHEST 1 VIEW COMPARISON:  None. FINDINGS: The heart size and mediastinal contours are within normal limits. Both lungs are clear. The visualized skeletal structures are unremarkable. IMPRESSION: No active disease. Electronically Signed   By: 06/02/2020 M.D.   On: 05/31/2020 20:43    Procedures Procedures   Medications Ordered in ED Medications - No data to display  ED Course  I have reviewed the triage vital signs and the nursing notes.  Pertinent labs & imaging results that were  available during my care of the patient were reviewed by me and considered in my medical decision making (see chart for details).  Clinical Course as of 06/01/20 1008  Fri May 31, 2020  2043 EKGs are not crossing in epic.  Normal sinus rhythm rate of 89 normal intervals no acute ST-T changes. [MB]  2047 Chest x-ray interpreted by me as no acute pulmonary disease. [MB]  2126 Reviewed work-up with patient.  Feel is the very low risk in his troponins undetectable do not think he needs a delta troponin.  Patient asked me if I think this might be a panic attack.  I told him there is no way to know for sure but he should follow-up with his primary care doctor.  Return instructions discussed [MB]    Clinical Course User Index [MB] Terrilee Files, MD   MDM Rules/Calculators/A&P                         This patient complains of right-sided chest pain and shortness of breath; this involves an extensive number of treatment Options and is a complaint that carries with it a high risk of complications and Morbidity. The differential includes pneumonia, pneumothorax, musculoskeletal, GERD, anxiety, PE  I ordered, reviewed and interpreted labs, which included CBC with normal Michalski count normal hemoglobin, chemistries and LFTs normal, troponin undetectable I ordered imaging studies which included chest x-ray and I independently    visualized and interpreted imaging  which showed no acute disease Previous records obtained and reviewed in epic, no recent admissions  After the interventions stated above, I reevaluated the patient and found patient to be minimally symptomatic and vital signs remained normal.  Commend close follow-up with his primary care doctor.  Return instructions discussed  Patient is PERC negative   Final Clinical Impression(s) / ED Diagnoses Final diagnoses:  Nonspecific chest pain    Rx / DC Orders ED Discharge Orders    None       Terrilee Files, MD 06/01/20 1010

## 2020-05-31 NOTE — Discharge Instructions (Addendum)
You are seen in the emergency department for right-sided chest pain and shortness of breath.  Your oxygen level was good and your chest x-ray EKG and blood counts did not show any obvious abnormalities.  Please contact your primary care doctor for close follow-up.

## 2020-05-31 NOTE — ED Triage Notes (Signed)
Pt reports right sided CP with SOB that started today. Recurrent similar pain x 1 month.

## 2020-06-03 ENCOUNTER — Telehealth: Payer: Self-pay

## 2020-06-03 NOTE — Telephone Encounter (Signed)
Nurse Assessment Nurse: Burman Blacksmith, RN, Kathlene November Date/Time Lamount Cohen Time): 06/03/2020 11:24:04 AM Confirm and document reason for call. If symptomatic, describe symptoms. ---Caller states: He is having across the chest chest pains described as sharp 6/10 and unrelated difficulty breathing X 2 weeks and intermittent dizziness. he has been to urgent care and ER for this. Denies any other current symptoms Does the patient have any new or worsening symptoms? ---Yes Will a triage be completed? ---Yes Related visit to physician within the last 2 weeks? ---Yes Does the PT have any chronic conditions? (i.e. diabetes, asthma, this includes High risk factors for pregnancy, etc.) ---No Is this a behavioral health or substance abuse call? ---No Guidelines Guideline Title Affirmed Question Affirmed Notes Nurse Date/Time Lamount Cohen Time) Chest Pain Dizziness or lightheadedness Emch, RN, Kathlene November 06/03/2020 11:28:18 AM Disp. Time Lamount Cohen Time) Disposition Final User 06/03/2020 11:34:48 AM Go to ED Now Yes Emch, RN, Rosalita Levan Disagree/Comply Comply PLEASE NOTE: All timestamps contained within this report are represented as [place] Standard Time. CONFIDENTIALTY NOTICE: This fax transmission is intended only for the addressee. It contains information that is legally privileged, confidential or otherwise protected from use or disclosure. If you are not the intended recipient, you are strictly prohibited from reviewing, disclosing, copying using or disseminating any of this information or taking any action in reliance on or regarding this information. If you have received this fax in error, please notify us immediately by telephone so that we can arrange for its return to [place]. Phone: [phone], Toll-Free: [phone], Fax: [phone] Page: 2 of 2 Call Id: [id number] Caller Understands Yes PreDisposition Did not know what to do Care Advice Given Per Guideline GO TO ED NOW: * Leave now. Drive carefully. NOTE TO  TRIAGER - DRIVING: * Another adult should drive. * Patient should not delay going to the emergency department. BRING MEDICINES: * Bring a list of your current medicines when you go to the Emergency Department (ER). CALL EMS IF: * Severe difficulty breathing occurs * Passes out or becomes too weak to stand CARE ADVICE given per Chest Pain (Adult) guideline. Referrals GO TO FACILITY OTHER - SPECIF

## 2020-06-03 NOTE — Telephone Encounter (Signed)
Pt scheduled for 06/06/20.

## 2020-06-05 NOTE — Patient Instructions (Addendum)
20 year old male with approximately a month history of chest pain-started on the right side now more diffuse.  He is tender to palpation on exam-potential costochondritis.  Has been taking ibuprofen with minimal relief-recommended he try heating pad for 15 minutes 3 times a day and Tylenol instead.  I am concerned with his pain being worse after meals he may have developed an ulcer so we are also going to treat with omeprazole 40 mg for up to 2 months.  I asked him to update me in 2 weeks with how he is doing.  We will also check a urine to make sure no urinary tract infection as well as no blood in the urine-if any blood in the urine I will likely image his abdomen to look for kidney stones.  I also thought about diverticulitis but at his age and with very mild exam I think this is less likely-we would prefer not to image his abdomen unless we have to at his age to avoid radiation exposure but we certainly will if we need to if not improving.  Thankfully he is already scheduled for a follow-up in a month and we can check back and then or certainly sooner if needed for new or worsening symptoms or failure to improve  Please stop by lab before you go If you have mychart- we will send your results within 3 business days of [place] receiving them.  If you do not have mychart- we will call you about results within 5 business days of [place] receiving them.  *please also note that you will see labs on mychart as soon as they post. I will later go in and write notes on them- will say "notes from Dr. Durene Cal"   Recommended follow up: see above- keep visit next month

## 2020-06-05 NOTE — Progress Notes (Addendum)
Phone [phone] In person visit   Subjective:   Scott Durham is a 20 y.o. year old very pleasant male patient who presents for/with See problem oriented charting Chief Complaint  Patient presents with  . Chest Pain    Last EKG 05/31/2020  . Shortness of Breath  . Foot Swelling    Right foot   . Flank Pain    Accompanied by stomach pain    This visit occurred during the SARS-CoV-2 public health emergency.  Safety protocols were in place, including screening questions prior to the visit, additional usage of staff PPE, and extensive cleaning of exam room while observing appropriate contact time as indicated for disinfecting solutions.   Past Medical History-  Patient Active Problem List   Diagnosis Date Noted  . Allergic rhinitis 07/03/2015    Priority: Low  . Acne vulgaris 07/03/2015    Priority: Low  . Bilateral carpal tunnel syndrome 01/09/2020  . Migraine     Medications- reviewed and updated Current Outpatient Medications  Medication Sig Dispense Refill  . cetirizine (ZYRTEC ALLERGY) 10 MG tablet Take 1 tablet (10 mg total) by mouth daily. 30 tablet 0  . omeprazole (PRILOSEC) 40 MG capsule Take 1 capsule (40 mg total) by mouth daily. 30 capsule 1   No current facility-administered medications for this visit.     Objective:  BP 116/74   Pulse 77   Temp 98.9 F (37.2 C) (Temporal)   Ht 5\' 10"  (1.778 m)   Wt 213 lb 9.6 oz (96.9 kg)   SpO2 98%   BMI 30.65 kg/m  Gen: NAD, resting comfortably CV: RRR no murmurs rubs or gallops Right sided chest wall pain noted with palpation at sternum- less evere on left Lungs: CTAB no crackles, wheeze, rhonchi Abdomen: soft/diffusely mildly tender- seemed worse in LLQ/nondistended/normal bowel sounds. No rebound or guarding.  Ext: no edema Skin: warm, dry Neuro: grossly normal, moves all extremities     Assessment and Plan   #Chest Pain/SOB S: Patient was seen at a [place] urgent care on 05/08/2020 with lower chest  pain/malaise/cough-COVID-19 testing was negative and patient is Covid vaccinated-diagnosed with viral syndrome and recommended PCP follow-up with possible cardiology consult.  Patient was seen again on 05/31/2020 in the emergency department with progressively worsening right-sided chest pain for approximately a week but states pain still dated back to original urgent care visit.  At time of emergency room visit pain was 7 out of 10.  It had worsened that evening after dinner.  He also mentions sometimes feeling short of breath and having abdominal pain at times.  He vapes sometimes but no smoking or cocaine use.  No radiation of pain.  No trauma noted.  He did have some chest wall tenderness on exam.  EKG was reassuring.  CBC, CMP, troponin reassuring and patient had pain for over a week before this was tested.  Chest x-ray with no acute cardiopulmonary disease.  Patient was PERC negative for PE concern.  Patient symptoms improved in the emergency room.  Recommended follow-up with primary care  Today he reports has also had some lightheadedness fair amount of the time. Has also noted some right foot swelling just last night. Right lateral flank pain but can also radiate across the abdomen. Chest pain is now all over left and right not just on right side. Side pain varies from 3-7/10. Chest pain varies from 5-9/10. Both pains are all the time a tthis point. No calf pain. No heavy lifting at  work. No exertional chest pain  A/P: 20 year old previously healthy male with approximately a month history of chest pain-started on the right side now more diffuse.  He is tender to palpation on exam-potential costochondritis.  Has been taking ibuprofen with minimal relief-recommended he try heating pad for 15 minutes 3 times a day and Tylenol instead.  I am concerned with his pain being worse after meals he may have developed an ulcer so we are also going to treat with omeprazole 40 mg for up to 2 months.  I asked him to  update me in 2 weeks with how he is doing.  We will also check a urine to make sure no urinary tract infection as well as no blood in the urine-if any blood in the urine I will likely image his abdomen to look for kidney stones.  I also thought about diverticulitis but at his age and with very mild exam I think this is less likely-we would prefer not to image his abdomen unless we have to at his age to avoid radiation exposure but we certainly will if we need to if not improving.  Thankfully he is already scheduled for a follow-up in a month and we can check back and then or certainly sooner if needed for new or worsening symptoms or failure to improve -No exertional component to chest pain.  Does feel winded with activity but he states this is happened to him in the past and has not particularly worsened in the past-he has been more sedentary related to the pain and what affects contributing.  Has already had chest x-ray.  Doubt pneumonia or CAD (reasonable basic workup in ER- do not think needs cardiology visit or stress testing unless fails to improve) .  Doubt pulmonary embolism as PERC negative-no reason to suspect DVT/PE and no obvious risk factors including no family history of blood clots  Addendum 06/08/20 patient reached out and stated only having 2-3 bowel movements a week. I am going to place him on miralax to get bowel movements more regular to see if helps with abdominal pain/flank pain portion  Recommended follow up: keep visit next month or sooner if needed Future Appointments  Date Time Provider Department Center  07/10/2020 10:40 AM Shelva Majestic, MD [facility]    Lab/Order associations:   ICD-10-CM   1. Chest pain, unspecified type  R07.9 CBC with Differential/Platelet    Comprehensive metabolic panel    Lipase  2. LLQ pain  R10.32 POCT Urinalysis Dipstick (Automated)    Meds ordered this encounter  Medications  . omeprazole (PRILOSEC) 40 MG capsule    Sig: Take 1 capsule  (40 mg total) by mouth daily.    Dispense:  30 capsule    Refill:  1    Time Spent: 23 minutes of total time (8:00 AM- 8:23 AM) was spent on the date of the encounter performing the following actions: chart review prior to seeing the patient, obtaining history, performing a medically necessary exam, counseling on the treatment plan, placing orders, and documenting in our EHR.   Return precautions advised.  Tana Conch, MD

## 2020-06-06 ENCOUNTER — Ambulatory Visit: Payer: 59 | Admitting: Family Medicine

## 2020-06-06 ENCOUNTER — Other Ambulatory Visit: Payer: Self-pay

## 2020-06-06 ENCOUNTER — Encounter: Payer: Self-pay | Admitting: Family Medicine

## 2020-06-06 ENCOUNTER — Other Ambulatory Visit (HOSPITAL_COMMUNITY): Payer: Self-pay | Admitting: Family Medicine

## 2020-06-06 VITALS — BP 116/74 | HR 77 | Temp 98.9°F | Ht 70.0 in | Wt 213.6 lb

## 2020-06-06 DIAGNOSIS — R1032 Left lower quadrant pain: Secondary | ICD-10-CM

## 2020-06-06 DIAGNOSIS — R079 Chest pain, unspecified: Secondary | ICD-10-CM

## 2020-06-06 LAB — POC URINALSYSI DIPSTICK (AUTOMATED)
Bilirubin, UA: NEGATIVE
Blood, UA: NEGATIVE
Glucose, UA: NEGATIVE
Ketones, UA: NEGATIVE
Leukocytes, UA: NEGATIVE
Nitrite, UA: NEGATIVE
Protein, UA: POSITIVE — AB
Spec Grav, UA: >=1.03 — AB (ref 1.010–1.025)
Urobilinogen, UA: 0.2 E.U./dL
pH, UA: 6 (ref 5.0–8.0)

## 2020-06-06 LAB — COMPREHENSIVE METABOLIC PANEL
ALT: 42 U/L (ref 0–53)
AST: 21 U/L (ref 0–37)
Albumin: 4.6 g/dL (ref 3.5–5.2)
Alkaline Phosphatase: 70 U/L (ref 52–171)
BUN: 17 mg/dL (ref 6–23)
CO2: 29 mEq/L (ref 19–32)
Calcium: 9.7 mg/dL (ref 8.4–10.5)
Chloride: 104 mEq/L (ref 96–112)
Creatinine, Ser: 0.98 mg/dL (ref 0.40–1.50)
GFR: 112.04 mL/min (ref >60.00)
Glucose, Bld: 91 mg/dL (ref 70–99)
Potassium: 3.9 mEq/L (ref 3.5–5.1)
Sodium: 140 mEq/L (ref 135–145)
Total Bilirubin: 0.5 mg/dL (ref 0.2–1.2)
Total Protein: 7.3 g/dL (ref 6.0–8.3)

## 2020-06-06 LAB — CBC WITH DIFFERENTIAL/PLATELET
Basophils Absolute: 0.1 10*3/uL (ref 0.0–0.1)
Basophils Relative: 0.9 % (ref 0.0–3.0)
Eosinophils Absolute: 0.1 10*3/uL (ref 0.0–0.7)
Eosinophils Relative: 1.7 % (ref 0.0–5.0)
HCT: 47.1 % (ref 36.0–49.0)
Hemoglobin: 16.7 g/dL — ABNORMAL HIGH (ref 12.0–16.0)
Lymphocytes Relative: 39.7 % (ref 24.0–48.0)
Lymphs Abs: 3 10*3/uL (ref 0.7–4.0)
MCHC: 35.4 g/dL (ref 31.0–37.0)
MCV: 84.6 fl (ref 78.0–98.0)
Monocytes Absolute: 0.8 10*3/uL (ref 0.1–1.0)
Monocytes Relative: 10.4 % (ref 3.0–12.0)
Neutro Abs: 3.5 10*3/uL (ref 1.4–7.7)
Neutrophils Relative %: 47.3 % (ref 43.0–71.0)
Platelets: 272 10*3/uL (ref 150.0–575.0)
RBC: 5.57 Mil/uL (ref 3.80–5.70)
RDW: 12.9 % (ref 11.4–15.5)
WBC: 7.5 10*3/uL (ref 4.5–13.5)

## 2020-06-06 LAB — LIPASE: Lipase: 18 U/L (ref 11.0–59.0)

## 2020-06-06 MED ORDER — OMEPRAZOLE 40 MG PO CPDR: 40.0000 mg | DELAYED_RELEASE_CAPSULE | Freq: Every day | ORAL | 1 refills | Status: DC

## 2020-06-06 MED FILL — OMEPRAZOLE 40 MG CPDR: 40 | 30 days supply | Qty: 30 | Fill #0

## 2020-06-06 NOTE — Addendum Note (Signed)
Addended by: Lurlean Horns on: 06/06/2020 08:35 AM   Modules accepted: Orders

## 2020-07-05 NOTE — Patient Instructions (Addendum)
Please stop by lab before you go If you have mychart- we will send your results within 3 business days of [place] receiving them.  If you do not have mychart- we will call you about results within 5 business days of [place] receiving them.  *please also note that you will see labs on mychart as soon as they post. I will later go in and write notes on them- will say "notes from Dr. Durene Cal"  We will call you within two weeks about your referral to [facility]. If you do not hear within 2 weeks, give [place] a call.   Call Dr. Denyse Amass to schedule follow-up since your carpal tunnel is worsening  myfitnesspal is a good app- if you accurately track calories you will lose weight  Finish full course of omeprazole  I would call [place] attention specialist and tell them youd like an appointment as concerned about ADHD. If they need a formal referral I am happy to place this for you [place]: [phone]

## 2020-07-05 NOTE — Progress Notes (Signed)
Phone: [phone]    Subjective:  Patient presents today for their annual physical. Chief complaint-noted.   See problem oriented charting- ROS- full  review of systems was completed and negative  except for: dental problems- working with dentist, some anxiety, chest pain improved on omeprazole, light sensitivity- recommended seeing eye doctor   The following were reviewed and entered/updated in epic: Past Medical History:  Diagnosis Date  . Migraine    saw neurology in teenage years- now off all medicines. had been on amitriptyline, sumatriptan, and nauseas medicine    Patient Active Problem List   Diagnosis Date Noted  . Allergic rhinitis 07/03/2015    Priority: Low  . Acne vulgaris 07/03/2015    Priority: Low  . Bilateral carpal tunnel syndrome 01/09/2020  . Migraine    Past Surgical History:  Procedure Laterality Date  . APPENDECTOMY    . CHOLECYSTECTOMY    . TONSILLECTOMY AND ADENOIDECTOMY      Family History  Problem Relation Age of Onset  . Hiatal hernia Mother   . GER disease Mother   . Other Father        unknown history  . Hyperlipidemia Maternal Grandmother   . Hyperlipidemia Maternal Grandfather   . Aortic aneurysm Paternal Grandmother        had surgery  . Other Paternal Grandfather        back pain issue,   . Cancer - Other Paternal Grandfather     Medications- reviewed and updated Current Outpatient Medications  Medication Sig Dispense Refill  . cetirizine (ZYRTEC) 10 MG tablet TAKE 1 TABLET (10 MG TOTAL) BY MOUTH DAILY. 100 tablet 0  . omeprazole (PRILOSEC) 40 MG capsule TAKE 1 CAPSULE (40 MG TOTAL) BY MOUTH DAILY. 30 capsule 1   No current facility-administered medications for this visit.    Allergies-reviewed and updated Allergies  Allergen Reactions  . Penicillins Rash  . Benzoyl Peroxide     hives  . Adhesive [Tape] Rash    Social History   Social History Narrative   Single- lives with mom.       Planning on [place] and  considering university after that. Wants to work on stock/financing.    [employer] in 2021.    Working Visual merchandiser   Prior [employer], Engineer, water      Hobbies: time with friends      Objective:  BP 126/90   Pulse 67   Temp 99.2 F (37.3 C) (Temporal)   Ht 5' 10"  (1.778 m)   Wt 214 lb 9.6 oz (97.3 kg)   SpO2 98%   BMI 30.79 kg/m  Gen: NAD, resting comfortably HEENT: Mucous membranes are moist. Oropharynx normal Neck: no thyromegaly No reported chest walltenderness CV: RRR no murmurs rubs or gallops Lungs: CTAB no crackles, wheeze, rhonchi Abdomen: soft/nontender/nondistended/normal bowel sounds. No rebound or guarding.  Ext: no edema Skin: warm, dry Neuro: grossly normal, moves all extremities, PERRLA     Assessment and Plan:  20 y.o. male presenting for annual physical.  Health Maintenance counseling: 1. Anticipatory guidance: Patient counseled regarding regular dental exams -q6 months, eye exams - has not seen in years- refer now due to some near sightedness and light sensitivity,  avoiding smoking and second hand smoke , limiting alcohol to 2 beverages per day- technically should not drink and he does not, no drugs.   2. Risk factor reduction:  Advised patient of need for regular exercise and diet rich and fruits and vegetables to reduce risk of  heart attack and stroke. Exercise- trying to restart but hands bother them- he is going to try running since hands bothering him. Diet-weight trending up- discussed reducing calorie counting.  Wt Readings from Last 3 Encounters:  07/10/20 214 lb 9.6 oz (97.3 kg) (96 %, Z= 1.78)*  06/06/20 213 lb 9.6 oz (96.9 kg) (96 %, Z= 1.77)*  05/31/20 201 lb (91.2 kg) (93 %, Z= 1.49)*   * Growth percentiles are based on CDC (Boys, 2-20 Years) data.  3. Immunizations/screenings/ancillary studies- up to date fully. HCV screen with labs Immunization History  Administered Date(s) Administered  . DTaP 05/30/2001, 07/28/2001, 10/13/2001,  02/09/2003, 06/23/2005  . HPV 9-valent 07/09/2014  . HPV Quadrivalent 07/09/2014, 04/19/2015  . Hepatitis B 2001-01-12, 04/29/2001, 10/13/2001  . HiB (PRP-OMP) 05/30/2001, 07/28/2001, 10/13/2001, 06/18/2002  . IPV 05/30/2001, 07/28/2001, 08/09/2002, 02/09/2003, 06/23/2005  . Influenza,inj,Quad PF,6+ Mos 12/28/2015, 01/02/2020  . Influenza-Unspecified 12/19/2010, 12/21/2011, 03/22/2013, 03/22/2013, 04/19/2015, 12/28/2015, 12/31/2016  . MMR 06/23/2002, 06/23/2005  . Meningococcal B, OMV 05/02/2019, 07/10/2019  . Meningococcal Conjugate 03/22/2013, 05/02/2019  . Moderna Sars-Covid-2 Vaccination 03/04/2020, 04/04/2020  . Pneumococcal-Unspecified 05/30/2001, 07/28/2001, 10/13/2001, 02/09/2003  . Tdap 08/12/2011, 08/12/2011, 07/04/2018, 02/13/2019  . Varicella 06/23/2002, 06/23/2005   4. Prostate cancer screening- no family history, start at age 54  5. Colon cancer screening - thinks grandfather may have had this in 30s-  still start screening at age 57 unless he finds out this was sooner than mid 25s 6. Skin cancer screening/prevention- no dermatologist. advised regular sunscreen use. Denies worrisome, changing, or new skin lesions.  7. Testicular cancer screening- advised monthly self exams  8. STD screening- patient opts in - has had unprotected sex- always encouraged protection 9. Never smoker-   Status of chronic or acute concerns   #Chest pain/shortness of breath-patient was seen last month for a month of chest pain that started on the right side became more diffuse-had tenderness palpation on exam concerning for costochondritis.  Has been taking ibuprofen with minimal relief-would recommend changing to Tylenol and heating pad due to concern for potential gastric ulcer-he was also having some epigastric pain we decided to try omeprazole 40 mg for up to 2 months.  No exertional component to chest pain-thought less likely to be cardiac.  Had already had chest x-ray in urgent care as well as  EKG.  Also PERC negative  He was also having right lateral flank pain which radiated to his abdomen-urinalysis was reassuring.  We thought less likely to be diverticulitis with mild exam  He was also only having 2-3 bowel movements a week and we placed him on MiraLAX to see if that would help  Today reports symptoms improved on medicine but has missed some doses due to his schedule. Pain recurred about a week ago but is doing better again- being more consistent with omeprazole again. Shortness of breath resolved. He will let us know if owrsening symptoms- would liek for him to go ahead and complete 2 months worth of medicine and discontinue all naproxen/aleve/ibuprofen etc.   #Migraine without aura-has continued to do well.  Phenergan usually helps with nausea proportion.  Not having use Frova recently- now off med list   #Allergic rhinitis-no recent issues . Not having to use zyrtec recently  #screening for hyperlipidemia S: Medication:none  Lab Results  Component Value Date   CHOL 141 07/10/2019   HDL 65.20 07/10/2019   LDLCALC 64 07/10/2019   TRIG 58.0 07/10/2019   CHOLHDL 2 07/10/2019   A/P: #s looked good  last year- check every 5 years   #carpal tunnel- symptoms worse in last week. Gabapentin helped in past but symptoms came back. Encouraged him to schedule follow up with Dr. Georgina Snell  #focus concerns- has been told issues most of life. Has had some anxiety in past.  From avs "I would call [place] attention specialist and tell them youd like an appointment as concerned about ADHD. If they need a formal referral I am happy to place this for you [facility]: [phone]"  Recommended follow up: Return in about 1 year (around 07/10/2021) for physical or sooner if needed such as if chest issues return.  Lab/Order associations: fasting   ICD-10-CM   1. Preventative health care  Z00.00 HIV Antibody (routine testing w rflx)    RPR    Urine cytology ancillary only    Hepatitis C antibody   2. Encounter for hepatitis C screening test for low risk patient  Z11.59 Hepatitis C antibody  3. Myopia of both eyes  H52.13 Ambulatory referral to Ophthalmology  4. Screening for gonorrhea  Z11.3 Urine cytology ancillary only  5. Screening for chlamydial disease  Z11.8 Urine cytology ancillary only  6. Screening for HIV (human immunodeficiency virus)  Z11.4 HIV Antibody (routine testing w rflx)  7. Screening examination for venereal disease  Z11.3 RPR   Return precautions advised.  Garret Reddish, MD

## 2020-07-10 ENCOUNTER — Other Ambulatory Visit (HOSPITAL_COMMUNITY)
Admission: RE | Admit: 2020-07-10 | Discharge: 2020-07-10 | Disposition: A | Discharge status: Home / Self Care | Payer: 59 | Source: Ambulatory Visit | Attending: Family Medicine | Admitting: Family Medicine

## 2020-07-10 ENCOUNTER — Other Ambulatory Visit: Payer: Self-pay

## 2020-07-10 ENCOUNTER — Ambulatory Visit (INDEPENDENT_AMBULATORY_CARE_PROVIDER_SITE_OTHER): Payer: 59 | Admitting: Family Medicine

## 2020-07-10 ENCOUNTER — Encounter: Payer: Self-pay | Admitting: Family Medicine

## 2020-07-10 VITALS — BP 126/86 | HR 67 | Temp 99.2°F | Ht 70.0 in | Wt 214.6 lb

## 2020-07-10 DIAGNOSIS — Z113 Encounter for screening for infections with a predominantly sexual mode of transmission: Secondary | ICD-10-CM | POA: Diagnosis not present

## 2020-07-10 DIAGNOSIS — Z118 Encounter for screening for other infectious and parasitic diseases: Secondary | ICD-10-CM

## 2020-07-10 DIAGNOSIS — Z0000 Encounter for general adult medical examination without abnormal findings: Secondary | ICD-10-CM | POA: Insufficient documentation

## 2020-07-10 DIAGNOSIS — Z114 Encounter for screening for human immunodeficiency virus [HIV]: Secondary | ICD-10-CM

## 2020-07-10 DIAGNOSIS — Z1159 Encounter for screening for other viral diseases: Secondary | ICD-10-CM

## 2020-07-10 DIAGNOSIS — H5213 Myopia, bilateral: Secondary | ICD-10-CM

## 2020-07-10 NOTE — Addendum Note (Signed)
Addended by: Lurlean Horns on: 07/10/2020 11:05 AM   Modules accepted: Orders

## 2020-07-11 LAB — RPR: RPR Ser Ql: NONREACTIVE

## 2020-07-11 LAB — HIV ANTIBODY (ROUTINE TESTING W REFLEX): HIV 1&2 Ab, 4th Generation: NONREACTIVE

## 2020-07-11 LAB — URINE CYTOLOGY ANCILLARY ONLY
Chlamydia: NEGATIVE
Comment: NEGATIVE
Comment: NEGATIVE
Comment: NORMAL
Neisseria Gonorrhea: NEGATIVE
Trichomonas: NEGATIVE

## 2020-07-11 LAB — HEPATITIS C ANTIBODY
Hepatitis C Ab: NONREACTIVE
SIGNAL TO CUT-OFF: 0.01 (ref <1.00)

## 2020-07-22 ENCOUNTER — Encounter: Payer: Self-pay | Admitting: Family Medicine

## 2020-07-22 ENCOUNTER — Other Ambulatory Visit: Payer: Self-pay

## 2020-07-22 ENCOUNTER — Ambulatory Visit (INDEPENDENT_AMBULATORY_CARE_PROVIDER_SITE_OTHER): Payer: 59 | Admitting: Family Medicine

## 2020-07-22 ENCOUNTER — Ambulatory Visit: Payer: Self-pay

## 2020-07-22 VITALS — BP 120/84 | HR 84 | Ht 70.0 in | Wt 220.8 lb

## 2020-07-22 DIAGNOSIS — M79641 Pain in right hand: Secondary | ICD-10-CM

## 2020-07-22 DIAGNOSIS — M79642 Pain in left hand: Secondary | ICD-10-CM | POA: Diagnosis not present

## 2020-07-22 DIAGNOSIS — R200 Anesthesia of skin: Secondary | ICD-10-CM

## 2020-07-22 NOTE — Patient Instructions (Signed)
Thank you for coming in today.  Please get labs today before you leave  I've referred you to Physical Therapy.  Let us know if you don't hear from them in one week.  Recheck in 6 weeks.   Please use voltaren gel up to 4x daily for pain as needed.   Heat can help.

## 2020-07-22 NOTE — Progress Notes (Signed)
I, Wendy Poet, LAT, ATC, am serving as scribe for Dr. Lynne Leader.  Scott Durham is a 20 y.o. male who presents to [facility] today for bilat hand pain and numbness. Pt was last seen by Dr. Georgina Snell on 01/15/20 and was given bilat carpal tunnel steroid injections and advised to con't night splints. He has tried Gabapentin previously.  Pt was also advised to f/u w/ rheumatology. Today, pt reports that his B hands have been bothering him again for approximately one month.  He has been to rheumatology and was told that all the tests they performed were normal.  He reports that the B carpal tunnel injections that he had helped for a few weeks/ one month.  He con't to wear his wrist splints/braces.  Dx imaging: 01/04/20 R & L hand XR  Pertinent review of systems: No fevers or chills  Relevant historical information: Normal nerve conduction study.  Negative rheumatologic work-up so far.   Exam:  BP 120/84 (BP Location: Left Arm, Patient Position: Sitting, Cuff Size: Normal)   Pulse 84   Ht _0  (1.778 m)   Wt 220 lb 12.8 oz (100.2 kg)   SpO2 97%   BMI 31.68 kg/m  General: Well Developed, well nourished, and in no acute distress.   MSK: Bilaterally slightly swollen.  Slight decreased range of motion.  Mildly tender palpation normal motion otherwise normal strength.    Lab and Radiology Results  EXAM: LEFT HAND - COMPLETE 3+ VIEW  COMPARISON:  None.  FINDINGS: Frontal, oblique, and lateral views were obtained. No fracture or dislocation. Joint spaces appear normal. No erosive change or periostitis. Bony mineralization is normal.  IMPRESSION: No fracture or dislocation.  No evident arthropathy.   Electronically Signed   By: Lowella Grip III M.D.   On: 01/05/2020 14:34    EXAM: RIGHT HAND - COMPLETE 3+ VIEW  COMPARISON:  None.  FINDINGS: Frontal, oblique, and lateral views were obtained. There is no fracture or dislocation.  The joint spaces appear normal. No erosive change or periostitis. Bony mineralization appears normal.  IMPRESSION: No fracture or dislocation.  No evident arthropathy.   Electronically Signed   By: Lowella Grip III M.D.   On: 01/05/2020 14:35  I, Lynne Leader, personally (independently) visualized and performed the interpretation of the images attached in this note.  Component     Latest Ref Rng & Units 12/31/2019 01/02/2020  WBC     4.5 - 13.0 Thousand/uL  10.0  RBC     4.10 - 5.70 Million/uL  5.29  Hemoglobin     12.0 - 16.9 g/dL  15.6  HCT     36.0 - 49.0 %  46.1  MCV     78.0 - 98.0 fL  87.1  MCH     25.0 - 35.0 pg  29.5  MCHC     31.0 - 36.0 g/dL  33.8  RDW     11.0 - 15.0 %  12.4  Platelets     140 - 400 Thousand/uL  275  MPV     7.5 - 12.5 fL  9.6  NEUT#     1,800 - 8,000 cells/uL  6,740  Lymphocyte #     1,200 - 5,200 cells/uL  2,480  Absolute Monocytes     200 - 900 cells/uL  710  Eosinophils Absolute     15 - 500 cells/uL  30  Basophils Absolute     0 - 200 cells/uL  40  Neutrophils     %  67.4  Total Lymphocyte     %  24.8  Monocytes Relative     %  7.1  Eosinophil     %  0.3  Basophil     %  0.4  Glucose     65 - 99 mg/dL  86  BUN     7 - 20 mg/dL  13  Creatinine     0.60 - 1.26 mg/dL  0.86  BUN/Creatinine Ratio     6 - 22 (calc)  NOT APPLICABLE  Sodium     026 - 146 mmol/L  139  Potassium     3.8 - 5.1 mmol/L  4.2  Chloride     98 - 110 mmol/L  105  CO2     20 - 32 mmol/L  27  Calcium     8.9 - 10.4 mg/dL  9.5  Total Protein     6.3 - 8.2 g/dL  6.8  Albumin MSPROF     3.6 - 5.1 g/dL  4.5  Globulin     2.1 - 3.5 g/dL (calc)  2.3  AG Ratio     1.0 - 2.5 (calc)  2.0  Total Bilirubin     0.2 - 1.1 mg/dL  0.7  Alkaline phosphatase (APISO)     46 - 169 U/L  67  AST     12 - 32 U/L  17  ALT     8 - 46 U/L  20  Uric Acid, Serum     2.1 - 7.6 mg/dL 6.6   CK Total     <245 U/L 77   Anti Nuclear Antibody (ANA)      NEGATIVE  NEGATIVE  RA Latex Turbid.     <14 IU/mL  <14  CRP     <8.0 mg/L  0.9  Sed Rate     0 - 15 mm/h  2    Assessment and Plan: 20 y.o. male with bilateral hand pain.  This is associated with swelling and stiffness.  Patient has had a reasonably good work-up so far including normal x-rays, normal early rheumatologic work-up listed above, and negative nerve conduction study.  Discussed options.  Plan to complete rheumatologic work-up listed below.  Additionally will refer to hand therapy which should be helpful.  Recheck in 6 weeks.   PDMP not reviewed this encounter. Orders Placed This Encounter  Procedures  . [place] LIMITED JOINT SPACE STRUCTURES UP BILAT(NO LINKED CHARGES)    Order Specific Question:   Reason for Exam (SYMPTOM  OR DIAGNOSIS REQUIRED)    Answer:   B hand pain    Order Specific Question:   Preferred imaging location?    Answer:   [place]  . LUPUS(12) PANEL    Standing Status:   Future    Number of Occurrences:   1    Standing Expiration Date:   07/22/2021  . HLA-B27 antigen    Standing Status:   Future    Number of Occurrences:   1    Standing Expiration Date:   07/22/2021  . Sedimentation rate    Standing Status:   Future    Number of Occurrences:   1    Standing Expiration Date:   07/22/2021  . Angiotensin converting enzyme    Standing Status:   Future    Number of Occurrences:   1    Standing Expiration Date:   07/22/2021  . Ambulatory  referral to Physical Therapy    Referral Priority:   Routine    Referral Type:   Physical Medicine    Referral Reason:   Specialty Services Required    Requested Specialty:   Physical Therapy    Number of Visits Requested:   1   No orders of the defined types were placed in this encounter.    Discussed warning signs or symptoms. Please see discharge instructions. Patient expresses understanding.   The above documentation has been reviewed and is accurate and complete Lynne Leader, M.D.

## 2020-07-23 LAB — SEDIMENTATION RATE: Sed Rate: 2 mm/hr (ref 0–15)

## 2020-07-23 NOTE — Progress Notes (Signed)
Sedimentation rate a marker of inflammation is normal

## 2020-07-24 LAB — LUPUS(12) PANEL
Anti Nuclear Antibody (ANA): NEGATIVE
C3 Complement: 117 mg/dL (ref 82–185)
C4 Complement: 22 mg/dL (ref 15–53)
ENA SM Ab Ser-aCnc: <1 AI
Rheumatoid fact SerPl-aCnc: <14 IU/mL (ref <14)
Ribosomal P Protein Ab: <1 AI
SM/RNP: <1 AI
SSA (Ro) (ENA) Antibody, IgG: <1 AI
SSB (La) (ENA) Antibody, IgG: <1 AI
Scleroderma (Scl-70) (ENA) Antibody, IgG: <1 AI
Thyroperoxidase Ab SerPl-aCnc: <1 IU/mL (ref <9)
ds DNA Ab: <1 IU/mL

## 2020-07-24 LAB — HLA-B27 ANTIGEN: HLA-B27 Antigen: NEGATIVE

## 2020-07-24 LAB — ANGIOTENSIN CONVERTING ENZYME: Angiotensin-Converting Enzyme: 46 U/L (ref 9–67)

## 2020-07-25 NOTE — Progress Notes (Signed)
Rheumatologic tests are negative.  It is very unlikely that you have a rheumatologic problem.

## 2020-07-31 DIAGNOSIS — M25442 Effusion, left hand: Secondary | ICD-10-CM | POA: Diagnosis not present

## 2020-07-31 DIAGNOSIS — R531 Weakness: Secondary | ICD-10-CM | POA: Diagnosis not present

## 2020-07-31 DIAGNOSIS — M25441 Effusion, right hand: Secondary | ICD-10-CM | POA: Diagnosis not present

## 2020-07-31 DIAGNOSIS — M79641 Pain in right hand: Secondary | ICD-10-CM | POA: Diagnosis not present

## 2020-07-31 DIAGNOSIS — M79642 Pain in left hand: Secondary | ICD-10-CM | POA: Diagnosis not present

## 2020-07-31 DIAGNOSIS — R202 Paresthesia of skin: Secondary | ICD-10-CM | POA: Diagnosis not present

## 2020-08-22 ENCOUNTER — Emergency Department (HOSPITAL_BASED_OUTPATIENT_CLINIC_OR_DEPARTMENT_OTHER): Payer: 59

## 2020-08-22 ENCOUNTER — Encounter (HOSPITAL_BASED_OUTPATIENT_CLINIC_OR_DEPARTMENT_OTHER): Payer: Self-pay | Admitting: Emergency Medicine

## 2020-08-22 ENCOUNTER — Emergency Department (HOSPITAL_BASED_OUTPATIENT_CLINIC_OR_DEPARTMENT_OTHER)
Admission: EM | Admit: 2020-08-22 | Discharge: 2020-08-22 | Disposition: A | Discharge status: Home / Self Care | Payer: 59 | Attending: Emergency Medicine | Admitting: Emergency Medicine

## 2020-08-22 ENCOUNTER — Encounter: Payer: Self-pay | Admitting: Family Medicine

## 2020-08-22 ENCOUNTER — Other Ambulatory Visit: Payer: Self-pay

## 2020-08-22 DIAGNOSIS — N50811 Right testicular pain: Secondary | ICD-10-CM | POA: Insufficient documentation

## 2020-08-22 LAB — URINALYSIS, ROUTINE W REFLEX MICROSCOPIC
Bilirubin Urine: NEGATIVE
Glucose, UA: NEGATIVE mg/dL
Hgb urine dipstick: NEGATIVE
Ketones, ur: NEGATIVE mg/dL
Leukocytes,Ua: NEGATIVE
Nitrite: NEGATIVE
Protein, ur: NEGATIVE mg/dL
Specific Gravity, Urine: 1.01 (ref 1.005–1.030)
pH: 6 (ref 5.0–8.0)

## 2020-08-22 IMAGING — US US SCROTUM W/ DOPPLER COMPLETE
1 series · 14 of 25 positions shown · non-contrast
Comparison: None.

CLINICAL DATA: Acute right testicular pain.

EXAM:
SCROTAL ULTRASOUND
DOPPLER ULTRASOUND OF THE TESTICLES
TECHNIQUE: Complete ultrasound examination of the testicles, epididymis, and
other scrotal structures was performed. Color and spectral Doppler
ultrasound were also utilized to evaluate blood flow to the
testicles.

[Series 1: us scrotum w/ doppler complete · 14 of 32 slices shown]
[im 1/32]
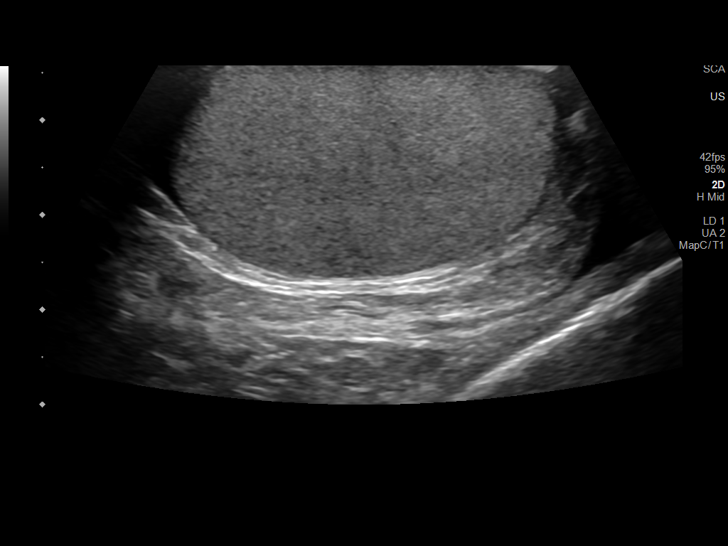
[im 3/32]
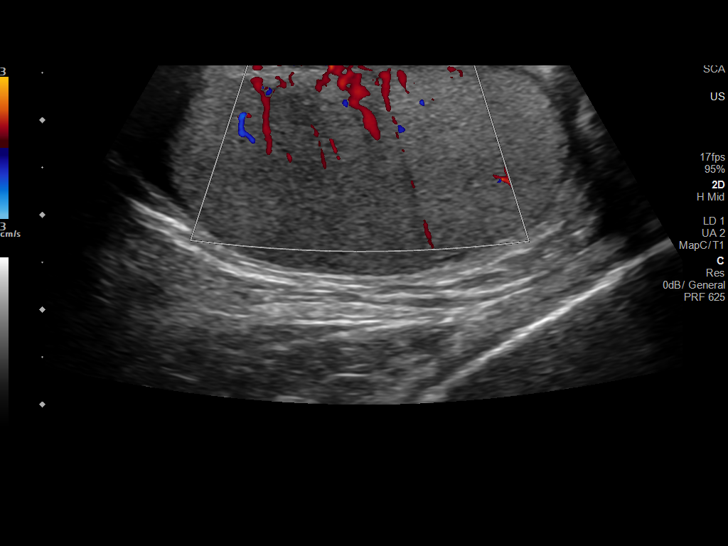
[im 6/32]
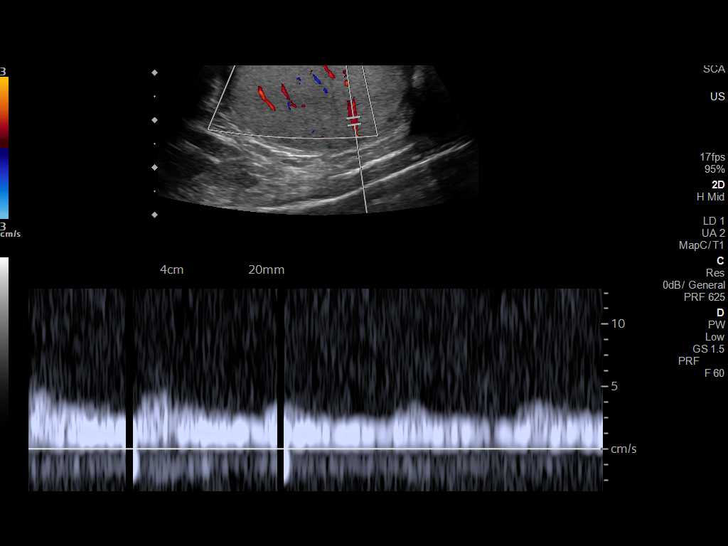
[im 8/32]
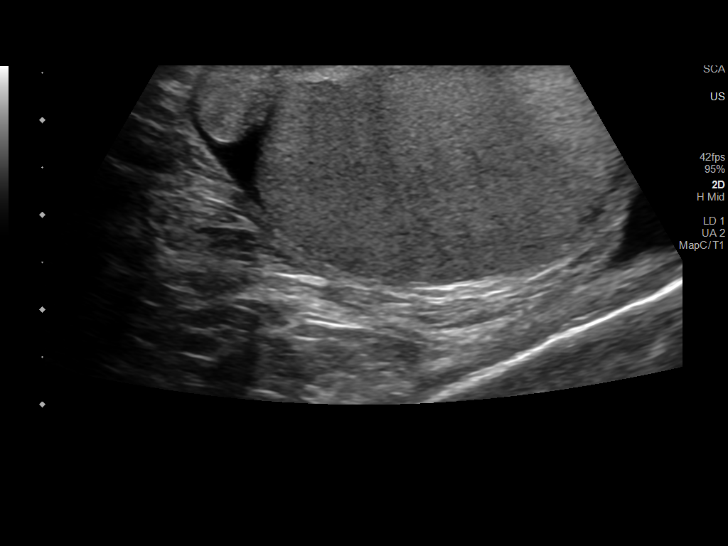
[im 11/32]
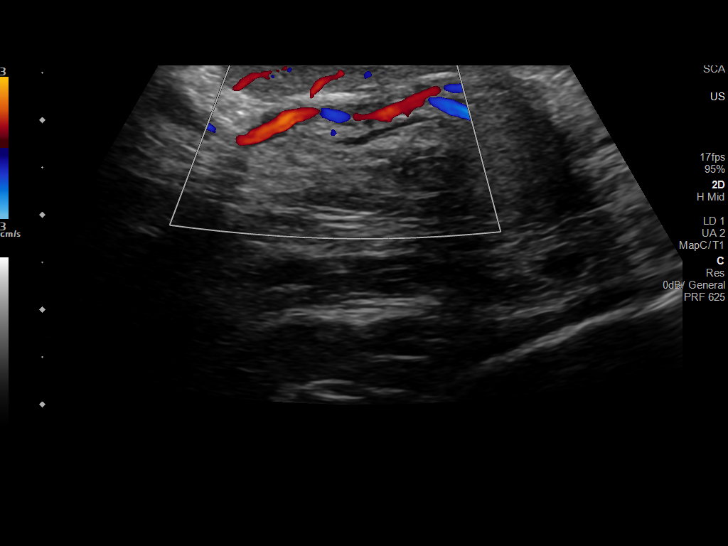
[im 12/32]
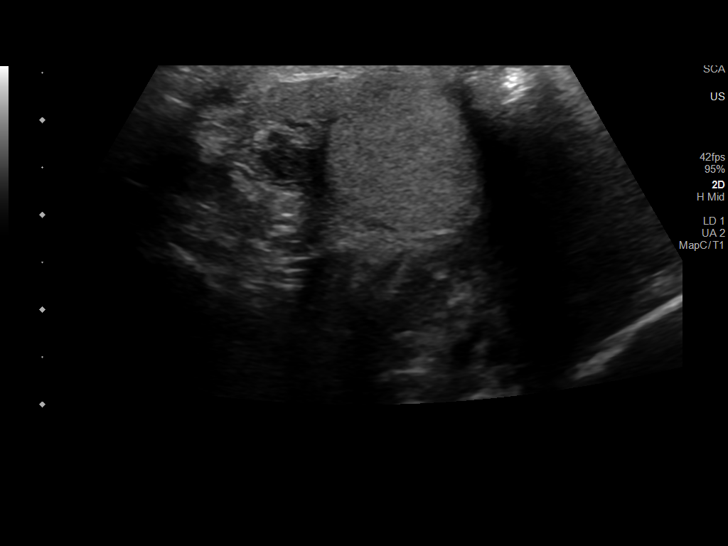
[im 15/32]
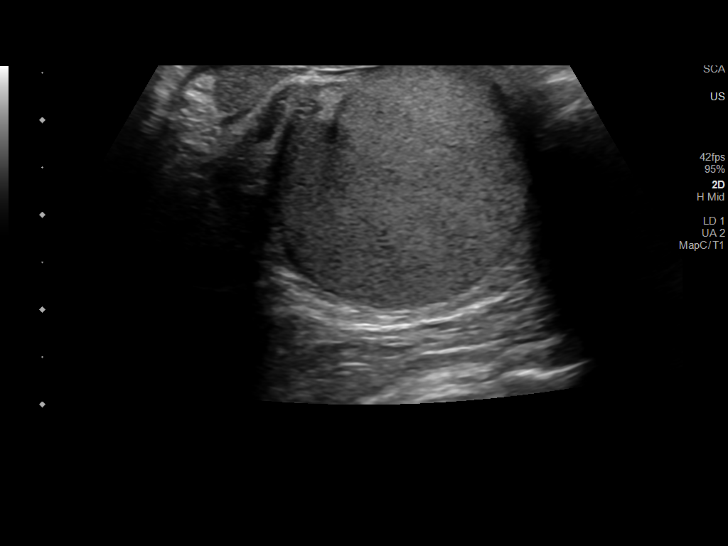
[im 17/32]
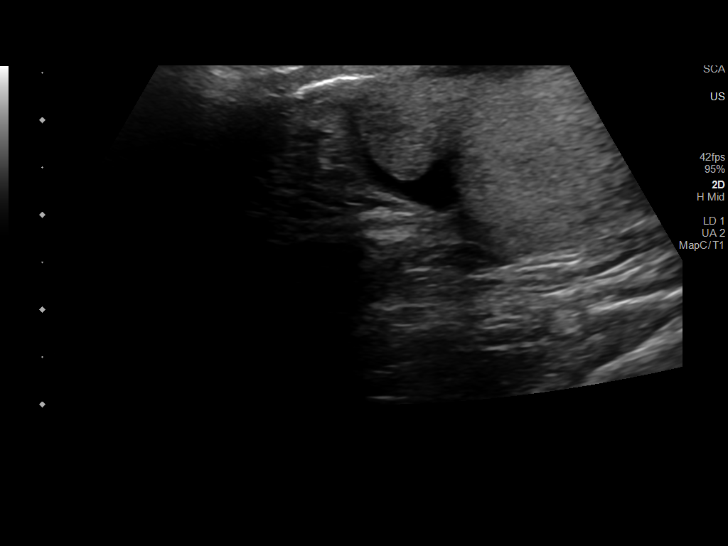
[im 20/32]
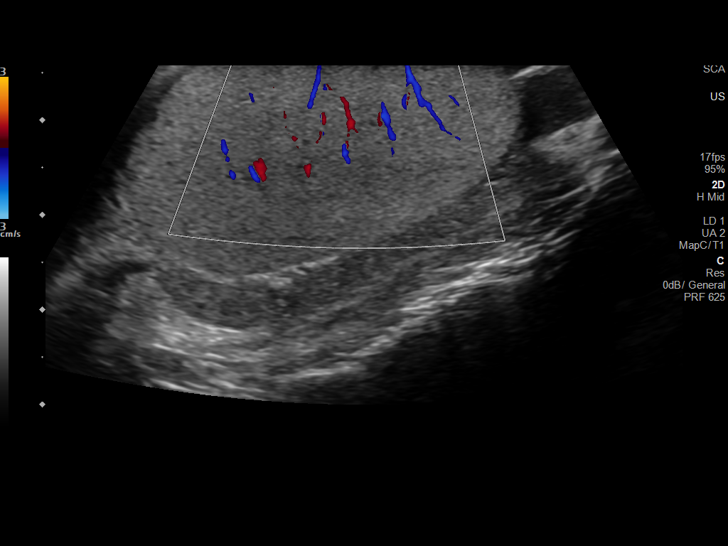
[im 21/32]
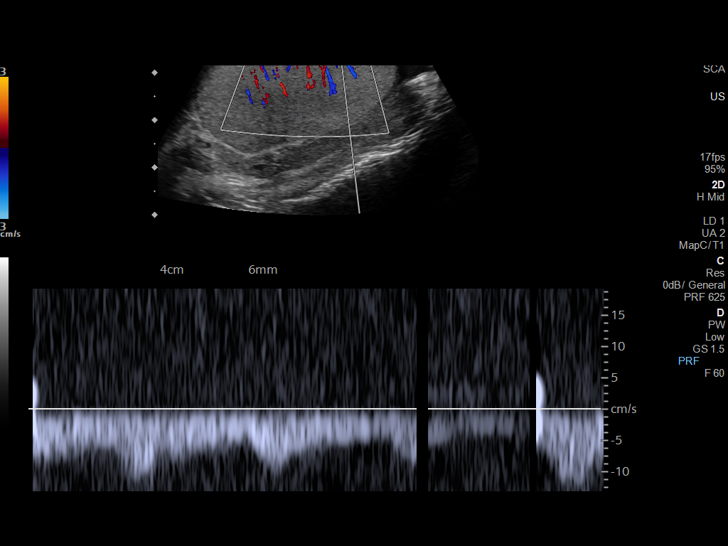
[im 24/32]
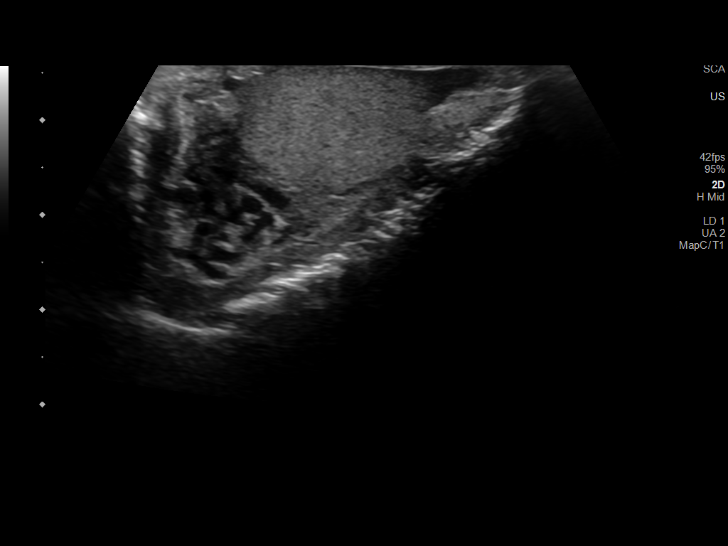
[im 26/32]
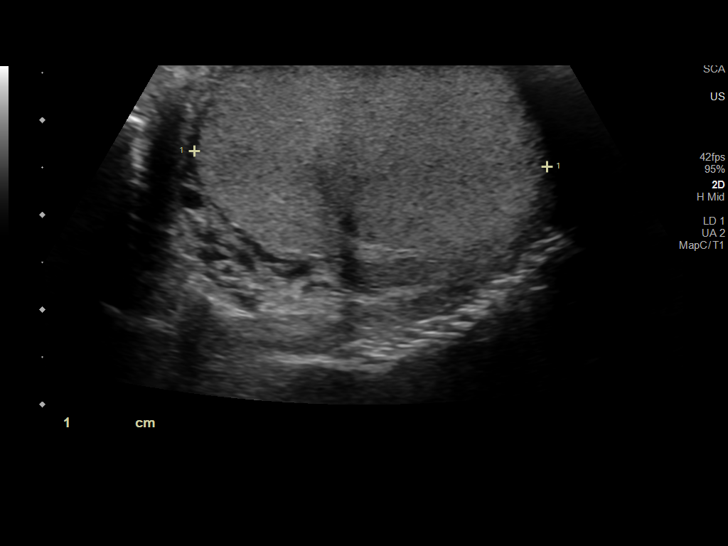
[im 29/32]
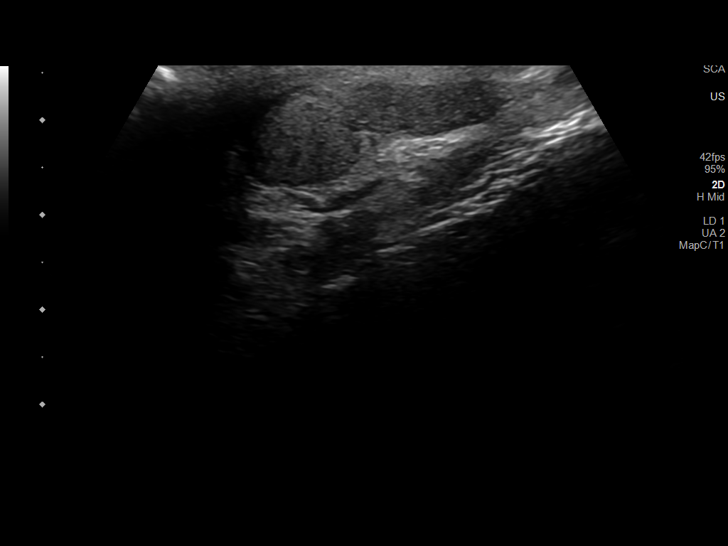
[im 32/32]
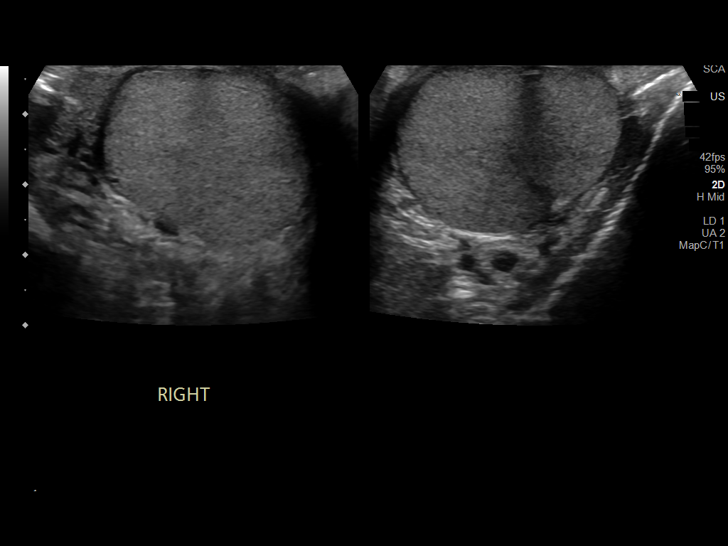

[14 of 25 positions shown; findings below may reference images not displayed]

FINDINGS: Right testicle

Measurements: 4.0 x 2.3 x 2.9 cm. No mass or microlithiasis
visualized.

Left testicle

Measurements: 4.4 x 3.7 x 2.1 cm. No mass or microlithiasis
visualized.

Right epididymis:  Normal in size and appearance.

Left epididymis:  Normal in size and appearance.

Hydrocele:  None visualized.

Varicocele:  None visualized.

Pulsed Doppler interrogation of both testes demonstrates normal low
resistance arterial and venous waveforms bilaterally.
IMPRESSION: No evidence of testicular mass or torsion. No definite abnormality
seen in the scrotum.

## 2020-08-22 NOTE — ED Provider Notes (Signed)
[facility] EMERGENCY DEPARTMENT Provider Note   CSN: [id number] Arrival date & time: 08/22/20  1413     History Chief Complaint  Patient presents with  . Testicle Pain    Scott Durham is a 20 y.o. male past medical history of appendectomy, cholecystectomy, presenting for evaluation of sudden onset of right testicular pain that began this morning upon awakening around 7:30 AM.  Pain is constant, unrelieved by Tylenol.  Has never had pain like this before.  Denies associated testicular swelling, scrotal swelling, penile discharge or dysuria, abdominal pain, nausea, vomiting, fevers.  He is sexually active with male partner without protection.  Last meal was breakfast time.  The history is provided by the patient.       Past Medical History:  Diagnosis Date  . Migraine    saw neurology in teenage years- now off all medicines. had been on amitriptyline, sumatriptan, and nauseas medicine     Patient Active Problem List   Diagnosis Date Noted  . Migraine   . Allergic rhinitis 07/03/2015  . Acne vulgaris 07/03/2015    Past Surgical History:  Procedure Laterality Date  . APPENDECTOMY    . CHOLECYSTECTOMY    . TONSILLECTOMY AND ADENOIDECTOMY         Family History  Problem Relation Age of Onset  . Hiatal hernia Mother   . GER disease Mother   . Other Father        unknown history  . Hyperlipidemia Maternal Grandmother   . Hyperlipidemia Maternal Grandfather   . Aortic aneurysm Paternal Grandmother        had surgery  . Other Paternal Grandfather        back pain issue,   . Cancer - Other Paternal Grandfather     Social History   Tobacco Use  . Smoking status: Never Smoker  . Smokeless tobacco: Never Used  Vaping Use  . Vaping Use: Never used  Substance Use Topics  . Alcohol use: No  . Drug use: Yes    Types: Marijuana    Home Medications Prior to Admission medications   Medication Sig Start Date End Date Taking? Authorizing Provider   cetirizine (ZYRTEC) 10 MG tablet TAKE 1 TABLET (10 MG TOTAL) BY MOUTH DAILY. 05/08/20 05/08/21  Wallis Bamberg, PA-C  omeprazole (PRILOSEC) 40 MG capsule TAKE 1 CAPSULE (40 MG TOTAL) BY MOUTH DAILY. 06/06/20 06/06/21  Shelva Majestic, MD  gabapentin (NEURONTIN) 300 MG capsule Take 1 capsule (300 mg total) by mouth 3 (three) times daily as needed (nerve pain). 01/09/20 05/08/20  Rodolph Bong, MD  promethazine (PHENERGAN) 25 MG tablet Take 25 mg by mouth every 6 (six) hours as needed for nausea or vomiting.  12/31/19  [provider]    Allergies    Penicillins, Benzoyl peroxide, and Adhesive [tape]  Review of Systems   Review of Systems  Genitourinary: Positive for testicular pain.  All other systems reviewed and are negative.   Physical Exam Updated Vital Signs BP 131/70   Pulse 80   Temp 98.2 F (36.8 C) (Oral)   Resp 18   Ht 5\' 10"  (1.778 m)   Wt 97.5 kg   SpO2 99%   BMI 30.85 kg/m   Physical Exam Vitals and nursing note reviewed.  Constitutional:      General: He is not in acute distress.    Appearance: He is well-developed. He is not ill-appearing.  HENT:     Head: Normocephalic and atraumatic.  Eyes:  Conjunctiva/sclera: Conjunctivae normal.  Cardiovascular:     Rate and Rhythm: Normal rate and regular rhythm.  Pulmonary:     Effort: Pulmonary effort is normal. No respiratory distress.     Breath sounds: Normal breath sounds.  Abdominal:     General: Bowel sounds are normal.     Palpations: Abdomen is soft.     Tenderness: There is no abdominal tenderness.  Genitourinary:    Comments: Exam performed with RN chaperone present.  Right testicle is minimally tender, no appreciated edema of the testicle or epididymis.  No scrotal erythema or induration.  No penile discharge, rashes or lesions.  Penis is circumcised. Skin:    General: Skin is warm.  Neurological:     Mental Status: He is alert.  Psychiatric:        Behavior: Behavior normal.     ED Results /  Procedures / Treatments   Labs (all labs ordered are listed, but only abnormal results are displayed) Labs Reviewed  URINALYSIS, ROUTINE W REFLEX MICROSCOPIC  GC/CHLAMYDIA PROBE AMP ([place]) NOT AT [facility]    EKG None  Radiology US SCROTUM W/DOPPLER  Result Date: 08/22/2020 CLINICAL DATA:  Acute right testicular pain. EXAM: SCROTAL ULTRASOUND DOPPLER ULTRASOUND OF THE TESTICLES TECHNIQUE: Complete ultrasound examination of the testicles, epididymis, and other scrotal structures was performed. Color and spectral Doppler ultrasound were also utilized to evaluate blood flow to the testicles. COMPARISON:  None. FINDINGS: Right testicle Measurements: 4.0 x 2.3 x 2.9 cm. No mass or microlithiasis visualized. Left testicle Measurements: 4.4 x 3.7 x 2.1 cm. No mass or microlithiasis visualized. Right epididymis:  Normal in size and appearance. Left epididymis:  Normal in size and appearance. Hydrocele:  None visualized. Varicocele:  None visualized. Pulsed Doppler interrogation of both testes demonstrates normal low resistance arterial and venous waveforms bilaterally. IMPRESSION: No evidence of testicular mass or torsion. No definite abnormality seen in the scrotum. Electronically Signed   By: Lupita Raider M.D.   On: 08/22/2020 15:18    Procedures Procedures   Medications Ordered in ED Medications - No data to display  ED Course  I have reviewed the triage vital signs and the nursing notes.  Pertinent labs & imaging results that were available during my care of the patient were reviewed by me and considered in my medical decision making (see chart for details).    MDM Rules/Calculators/A&P                          Patient is a 20 year old male presenting for constant right testicular pain that began upon awakening this morning.  No associated symptoms.  He is sexually active with a single male partner without protection.  Exam reveals minimal tenderness to the right testicle without  deformity, swelling, or induration of the scrotum.  No penile discharge or rashes.  Torsion study was obtained and is negative.  UA is normal.  GC/chlamydia culture sent on urine.  He is aware these STD cultures will be pending, and is instructed to avoid sexual intercourse until he knows all of his results.  Low suspicion for torsion and detorsion as pain is constant in nature, with minimal tenderness and normal exam findings.  Recommend close monitoring and strict return precautions.  Discussed results, findings, treatment and follow up. Patient advised of return precautions. Patient verbalized understanding and agreed with plan.  Final Clinical Impression(s) / ED Diagnoses Final diagnoses:  Right testicular pain    Rx /  DC Orders ED Discharge Orders    None       Keaghan Staton, [place] N, PA-C 08/22/20 1634    Terrilee Files, MD 08/23/20 [phone]

## 2020-08-22 NOTE — Discharge Instructions (Signed)
Follow close with your primary care provider. Your urine test is normal. You still have STD tests processing in the lab. You will receive a call from the hospital if your test results come back positive. Avoid sexual activity until you know your test results. If your results come back positive, it is important that you inform all of your sexual partners.  Return to the ER for worsening testicular pain, new pain with bowel movements, abdominal pain, fever, or new or worsening symptoms.

## 2020-08-22 NOTE — ED Notes (Signed)
Patient transported to Ultrasound 

## 2020-08-22 NOTE — ED Notes (Signed)
Patient transported to US 

## 2020-08-22 NOTE — ED Notes (Signed)
Awoke at approx 0730hrs, noted rt scrotum was hurting, no blood in urine, no swelling. Pt noted some redness. Denies fevers, states he is sexually active with single partner.

## 2020-08-22 NOTE — ED Triage Notes (Signed)
Pt woke up this morning with right side testicular pain about 730am.  Pt reports constant pain since. Pt took 1000mg  tylenol at approx 12pm today with no relief.  Denies any n/v/difficulty with urination.

## 2020-08-22 NOTE — Telephone Encounter (Signed)
Nurse Assessment Nurse: Chilton Si, RN, Tanika Date/Time (Eastern Time): 08/22/2020 1:30:25 PM Confirm and document reason for call. If symptomatic, describe symptoms. ---Caller states he is having right testicular pain, started today no other symptoms. Does the patient have any new or worsening symptoms? ---Yes Will a triage be completed? ---Yes Related visit to physician within the last 2 weeks? ---No Does the PT have any chronic conditions? (i.e. diabetes, asthma, this includes High risk factors for pregnancy, etc.) ---No Is this a behavioral health or substance abuse call? ---No Guidelines Guideline Title Affirmed Question Affirmed Notes Nurse Date/Time (Eastern Time) Scrotum Swelling or Pain Pain in scrotum or testicle (Exception: transient pain and occurred once) Chilton Si, RN, Tanika 08/22/2020 1:31:01 PM Disp. Time Lamount Cohen Time) Disposition Final User 08/22/2020 1:27:02 PM Send to Urgent Blima Singer, Tiffany 08/22/2020 1:32:22 PM Go to ED Now (or PCP triage) Yes Chilton Si, RN, Tanika PLEASE NOTE: All timestamps contained within this report are represented as [place] Standard Time. CONFIDENTIALTY NOTICE: This fax transmission is intended only for the addressee. It contains information that is legally privileged, confidential or otherwise protected from use or disclosure. If you are not the intended recipient, you are strictly prohibited from reviewing, disclosing, copying using or disseminating any of this information or taking any action in reliance on or regarding this information. If you have received this fax in error, please notify us immediately by telephone so that we can arrange for its return to [place]. Phone: [phone], Toll-Free: [phone], Fax: [phone] Page: 2 of 2 Call Id: [id number] Caller Disagree/Comply Comply Caller Understands Yes PreDisposition Did not know what to do Care Advice Given Per Guideline GO TO ED NOW (OR PCP TRIAGE): CARE ADVICE given per Scrotum  Swelling or Pain (Pediatric) guideline. Referrals GO TO FACILITY UNDECIDED GO TO FACILITY OTHER - SPECIF

## 2020-08-23 LAB — GC/CHLAMYDIA PROBE AMP (~~LOC~~) NOT AT ARMC
Chlamydia: NEGATIVE
Comment: NEGATIVE
Comment: NORMAL
Neisseria Gonorrhea: NEGATIVE

## 2020-08-23 NOTE — Telephone Encounter (Signed)
Called patient and he states he is doing much better since going to the ED. Will call if anything gets worse.

## 2020-08-23 NOTE — Telephone Encounter (Signed)
Please schedule OV for pt. 

## 2020-08-30 NOTE — Progress Notes (Deleted)
   I, Peterson Lombard, LAT, ATC acting as a scribe for Lynne Leader, MD.  Scott Durham is a 20 y.o. male who presents to [place] at [facility] today for f/u bilat hand pain and numbness. Pt was last seen by Dr. Georgina Snell on 07/22/20 and a rheumatologic work-up was obtained and was referred to [facility] PT for hand therapy and has completed # visits. Today, pt reports  Dx testing: 07/22/20 Labs (Lupus panel, HLA-B27 antigen, sed rate, angiotensin)  03/12/20 NCV study   01/04/20 R & L hand XR  Pertinent review of systems: ***  Relevant historical information: ***   Exam:  There were no vitals taken for this visit. General: Well Developed, well nourished, and in no acute distress.   MSK: ***    Lab and Radiology Results No results found for this or any previous visit (from the past 72 hour(s)). No results found.     Assessment and Plan: 20 y.o. male with ***   PDMP not reviewed this encounter. No orders of the defined types were placed in this encounter.  No orders of the defined types were placed in this encounter.    Discussed warning signs or symptoms. Please see discharge instructions. Patient expresses understanding.   ***

## 2020-09-03 ENCOUNTER — Ambulatory Visit: Payer: 59 | Admitting: Family Medicine

## 2020-11-19 ENCOUNTER — Telehealth (INDEPENDENT_AMBULATORY_CARE_PROVIDER_SITE_OTHER): Payer: 59 | Admitting: Family Medicine

## 2020-11-19 DIAGNOSIS — J029 Acute pharyngitis, unspecified: Secondary | ICD-10-CM

## 2020-11-19 DIAGNOSIS — R059 Cough, unspecified: Secondary | ICD-10-CM

## 2020-11-19 DIAGNOSIS — R197 Diarrhea, unspecified: Secondary | ICD-10-CM

## 2020-11-19 DIAGNOSIS — Z20822 Contact with and (suspected) exposure to covid-19: Secondary | ICD-10-CM

## 2020-11-19 MED ORDER — BENZONATATE 100 MG PO CAPS
100.0000 mg | ORAL_CAPSULE | Freq: Three times a day (TID) | ORAL | 0 refills | Status: DC | PRN
  Filled 2020-11-19: qty 20, 7d supply, fill #0

## 2020-11-19 NOTE — Patient Instructions (Addendum)
  HOME CARE TIPS:  -[place] COVID19 testing information: [email or web address] [place] [phone] Most pharmacies also offer testing and home test kits. If the Covid19 test is positive, please make a prompt follow up visit with your primary care office or with [place] to discuss treatment options. Treatments for Covid19 are best given early in the course of the illness.   -I sent the medication(s) we discussed to your pharmacy: Meds ordered this encounter  Medications   benzonatate (TESSALON PERLES) 100 MG capsule    Sig: Take 1 capsule (100 mg total) by mouth 3 (three) times daily as needed.    Dispense:  20 capsule    Refill:  0    -Can use Imodium for diarrhea if needed  -can use tylenol or aleve if needed for fevers, aches and pains per instructions  -can use nasal saline a few times per day if you have nasal congestion; sometimes  a short course of Afrin nasal spray for 3 days can help with symptoms as well  -stay hydrated, drink plenty of fluids and eat small healthy meals - avoid dairy  -can take 1000 IU ( ) Vit D3 and 100-500 mg of Vit C daily per instructions  -If the Covid test is positive, check out the [facility] website for more information on home care, transmission and treatment for COVID19  -follow up with your doctor in 2-3 days unless improving and feeling better  -stay home while sick, except to seek medical care. If you have COVID19, ideally it would be best to stay home for a full 10 days since the onset of symptoms PLUS one day of no fever and feeling better. Wear a good mask that fits snugly (such as N95 or KN95) if around others to reduce the risk of transmission.  It was nice to meet you today, and I really hope you are feeling better soon. I help [facility] out with telemedicine visits on Tuesdays and Thursdays and am available for visits on those days. If you have any concerns or questions following this visit please  schedule a follow up visit with your Primary Care doctor or seek care at a local urgent care clinic to avoid delays in care.    Seek in person care or schedule a follow up video visit promptly if your symptoms worsen, new concerns arise or you are not improving with treatment. Call 911 and/or seek emergency care if your symptoms are severe or life threatening.

## 2020-11-19 NOTE — Progress Notes (Signed)
Virtual Visit via Video Note  I connected with Scott Durham  on 11/19/20 at  6:00 PM EDT by a video enabled telemedicine application and verified that I am speaking with the correct person using two identifiers.  Location patient: home, [place] Location provider:work or home office Persons participating in the virtual visit: patient, provider, pt's girlfriend  I discussed the limitations of evaluation and management by telemedicine and the availability of in person appointments. The patient expressed understanding and agreed to proceed.   HPI:  Acute telemedicine visit for a sore throat and possible covid: -Onset: 2 days ago; girlfriend has covid and he has been around her closely, he did a covid test initially which was negative -Symptoms include: sore throat, cough, nasal congestion, diarrhea, low grade fever, feels tired, body aches -Denies:CP, SOB, vomiting, inability to eat/drink/get out of bed -Has tried: none -Pertinent past medical history: pt denies any other than listed below -Pertinent medication allergies:  Allergies  Allergen Reactions   Penicillins Rash   Benzoyl Peroxide     hives   Adhesive [Tape] Rash  -COVID-19 vaccine status: has doses - no booster  ROS: See pertinent positives and negatives per HPI.  Past Medical History:  Diagnosis Date   Migraine    saw neurology in teenage years- now off all medicines. had been on amitriptyline, sumatriptan, and nauseas medicine     Past Surgical History:  Procedure Laterality Date   APPENDECTOMY     CHOLECYSTECTOMY     TONSILLECTOMY AND ADENOIDECTOMY       Current Outpatient Medications:    benzonatate (TESSALON PERLES) 100 MG capsule, Take 1 capsule (100 mg total) by mouth 3 (three) times daily as needed., Disp: 20 capsule, Rfl: 0   cetirizine (ZYRTEC) 10 MG tablet, TAKE 1 TABLET (10 MG TOTAL) BY MOUTH DAILY., Disp: 100 tablet, Rfl: 0   omeprazole (PRILOSEC) 40 MG capsule, TAKE 1 CAPSULE (40 MG TOTAL) BY MOUTH DAILY., Disp:  30 capsule, Rfl: 1  EXAM:  VITALS per patient if applicable:  GENERAL: alert, oriented, appears well and in no acute distress  HEENT: atraumatic, conjunttiva clear, no obvious abnormalities on inspection of external nose and ears  NECK: normal movements of the head and neck  LUNGS: on inspection no signs of respiratory distress, breathing rate appears normal, no obvious gross SOB, gasping or wheezing  CV: no obvious cyanosis  MS: moves all visible extremities without noticeable abnormality  PSYCH/NEURO: pleasant and cooperative, no obvious depression or anxiety, speech and thought processing grossly intact  ASSESSMENT AND PLAN:  Discussed the following assessment and plan:  Close exposure to COVID-19 virus  Sore throat  Cough  Diarrhea, unspecified type  -we discussed possible serious and likely etiologies, options for evaluation and workup, limitations of telemedicine visit vs in person visit, treatment, treatment risks and precautions. Pt prefers to treat via telemedicine empirically rather than in person at this moment.  Suspect COVID as most likely.  Discussed other potential etiologies as well and options for further evaluation if any worseneing and/or covid testing remains negative.  Discussed isolation, potential complications, treatment options and precautions.  He has opted to retest at home or go to the pharmacy for testing.  He agrees to isolate.  Is opted for symptomatic care with Tessalon for cough, nasal saline and other symptomatic care measures summarized in patient instructions. Work/School slipped offered: declined Advised to seek prompt in person care if worsening, new symptoms arise, or if is not improving with treatment. Discussed options for inperson  care if PCP office not available. Did let this patient know that I only do telemedicine on Tuesdays and Thursdays for [place]. Advised to schedule follow up visit with PCP or [facility] if any further questions or  concerns to avoid delays in care.   I discussed the assessment and treatment plan with the patient. The patient was provided an opportunity to ask questions and all were answered. The patient agreed with the plan and demonstrated an understanding of the instructions.     Terressa Koyanagi, DO

## 2020-11-20 ENCOUNTER — Other Ambulatory Visit (HOSPITAL_BASED_OUTPATIENT_CLINIC_OR_DEPARTMENT_OTHER): Payer: Self-pay

## 2020-11-22 ENCOUNTER — Other Ambulatory Visit (HOSPITAL_BASED_OUTPATIENT_CLINIC_OR_DEPARTMENT_OTHER): Payer: Self-pay

## 2021-01-01 ENCOUNTER — Encounter: Payer: Self-pay | Admitting: Internal Medicine

## 2021-01-01 ENCOUNTER — Telehealth (INDEPENDENT_AMBULATORY_CARE_PROVIDER_SITE_OTHER): Payer: Self-pay | Admitting: Internal Medicine

## 2021-01-01 VITALS — Wt 212.0 lb

## 2021-01-01 DIAGNOSIS — J069 Acute upper respiratory infection, unspecified: Secondary | ICD-10-CM

## 2021-01-01 NOTE — Progress Notes (Signed)
Virtual Visit via Video Note  I connected with Scott Durham on 01/01/21 at  9:30 AM EDT by a video enabled telemedicine application and verified that I am speaking with the correct person using two identifiers.  Location patient: home Location provider: work office Persons participating in the virtual visit: patient, provider  I discussed the limitations of evaluation and management by telemedicine and the availability of in person appointments. The patient expressed understanding and agreed to proceed.   HPI: For the past week he has been having cough, congestion, no fever, no recent sick contacts or recent travel.  He has been taking allergy medication without relief.  He just took a COVID test today that was negative.  He is requesting a work note for missing work yesterday and today due to his symptoms.   ROS: Constitutional: Denies fever, chills, diaphoresis, appetite change. HEENT: Denies photophobia, eye pain, redness,  mouth sores, trouble swallowing, neck pain, neck stiffness and tinnitus.   Respiratory: Denies SOB, DOE, chest tightness,  and wheezing.   Cardiovascular: Denies chest pain, palpitations and leg swelling.  Gastrointestinal: Denies nausea, vomiting, abdominal pain, diarrhea, constipation, blood in stool and abdominal distention.  Genitourinary: Denies dysuria, urgency, frequency, hematuria, flank pain and difficulty urinating.  Endocrine: Denies: hot or cold intolerance, sweats, changes in hair or nails, polyuria, polydipsia. Musculoskeletal: Denies myalgias, back pain, joint swelling, arthralgias and gait problem.  Skin: Denies pallor, rash and wound.  Neurological: Denies dizziness, seizures, syncope, weakness, light-headedness, numbness and headaches.  Hematological: Denies adenopathy. Easy bruising, personal or family bleeding history  Psychiatric/Behavioral: Denies suicidal ideation, mood changes, confusion, nervousness, sleep disturbance and  agitation   Past Medical History:  Diagnosis Date   Migraine    saw neurology in teenage years- now off all medicines. had been on amitriptyline, sumatriptan, and nauseas medicine     Past Surgical History:  Procedure Laterality Date   APPENDECTOMY     CHOLECYSTECTOMY     TONSILLECTOMY AND ADENOIDECTOMY      Family History  Problem Relation Age of Onset   Hiatal hernia Mother    GER disease Mother    Other Father        unknown history   Hyperlipidemia Maternal Grandmother    Hyperlipidemia Maternal Grandfather    Aortic aneurysm Paternal Grandmother        had surgery   Other Paternal Grandfather        back pain issue,    Cancer - Other Paternal Grandfather     SOCIAL HX:   reports that he has never smoked. He has never used smokeless tobacco. He reports current drug use. Drug: Marijuana. He reports that he does not drink alcohol.   Current Outpatient Medications:    omeprazole (PRILOSEC) 40 MG capsule, TAKE 1 CAPSULE (40 MG TOTAL) BY MOUTH DAILY., Disp: 30 capsule, Rfl: 1  EXAM:   VITALS per patient if applicable: None reported  GENERAL: alert, oriented, appears well and in no acute distress, sounds congested  HEENT: atraumatic, conjunttiva clear, no obvious abnormalities on inspection of external nose and ears  NECK: normal movements of the head and neck  LUNGS: on inspection no signs of respiratory distress, breathing rate appears normal, no obvious gross increased work of breathing, gasping or wheezing  CV: no obvious cyanosis  MS: moves all visible extremities without noticeable abnormality  PSYCH/NEURO: pleasant and cooperative, no obvious depression or anxiety, speech and thought processing grossly intact  ASSESSMENT AND PLAN:   URI with  cough and congestion  -Given symptoms and presentation, PNA, pharyngitis, ear infection are not likely, hence abx have not been prescribed. -Have advised rest, fluids, OTC antihistamines, cough suppressants and  mucinex. -RTC if no improvement in 10-14 days. -Work note has been provided.    I discussed the assessment and treatment plan with the patient. The patient was provided an opportunity to ask questions and all were answered. The patient agreed with the plan and demonstrated an understanding of the instructions.   The patient was advised to call back or seek an in-person evaluation if the symptoms worsen or if the condition fails to improve as anticipated.    Chaya Jan, MD  [facility]

## 2021-03-07 ENCOUNTER — Telehealth (INDEPENDENT_AMBULATORY_CARE_PROVIDER_SITE_OTHER): Payer: 59 | Admitting: Family Medicine

## 2021-03-07 ENCOUNTER — Encounter: Payer: Self-pay | Admitting: Family Medicine

## 2021-03-07 DIAGNOSIS — J029 Acute pharyngitis, unspecified: Secondary | ICD-10-CM

## 2021-03-07 DIAGNOSIS — R051 Acute cough: Secondary | ICD-10-CM

## 2021-03-07 LAB — POC COVID19 BINAXNOW: SARS Coronavirus 2 Ag: NEGATIVE

## 2021-03-07 LAB — POCT RAPID STREP A (OFFICE): Rapid Strep A Screen: NEGATIVE

## 2021-03-07 LAB — POCT INFLUENZA A/B
Influenza A, POC: NEGATIVE
Influenza B, POC: NEGATIVE

## 2021-03-07 NOTE — Patient Instructions (Signed)
There are no preventive care reminders to display for this patient.  Recommended follow up: No follow-ups on file. 

## 2021-03-07 NOTE — Progress Notes (Signed)
   Phone [phone] Virtual visit via Video note   Subjective:  Chief complaint: Chief Complaint  Patient presents with   Sore Throat   Cough    Sx started 2 to 3 days ago Not taking any medication    This visit type was conducted due to national recommendations for restrictions regarding the COVID-19 Pandemic (e.g. social distancing).  This format is felt to be most appropriate for this patient at this time balancing risks to patient and risks to population by having him in for in person visit.  No physical exam was performed (except for noted visual exam or audio findings with Telehealth visits).    Our team/I connected with Scott Durham at  2:00 PM EST by a video enabled telemedicine application (doxy.me or caregility through epic) and verified that I am speaking with the correct person using two identifiers.  Location patient: [place] Location provider: [facility], office Persons participating in the virtual visit:  patient  Our team/I discussed the limitations of evaluation and management by telemedicine and the availability of in person appointments. In light of current covid-19 pandemic, patient also understands that we are trying to protect them by minimizing in office contact if at all possible.  The patient expressed consent for telemedicine visit and agreed to proceed. Patient understands insurance will be billed.   Past Medical History-  Patient Active Problem List   Diagnosis Date Noted   Allergic rhinitis 07/03/2015    Priority: Low   Acne vulgaris 07/03/2015    Priority: Low   Migraine     Medications- reviewed and updated Not currently on omeprazole-no other medications noted    Objective:  No self reported vitals-subjectively not febrile Gen: NAD, resting comfortably, appears somewhat fatigued Lungs: nonlabored, normal respiratory rate  Skin: appears dry, no obvious rash     Assessment and Plan   # cough/sore throat  S:for 2-3 days has had sore  throat- moderate to severe with moderate cough. Throat is very angry/red when he or mom looks at it. He was around someone that had strep and covid recently this week. No fever yet. No shortness of breath. No lymph nodes swollen in neck. Usually doesn't run fevers even when has strep.    No flu shot this year, last covid shot last year.  A/P: 20 year old male with cough as well a that s significant sore throat with exposure to COVIDand strep this week-we will have him by for rapid COVID, strep, flu testing and treat accordingly  Recommended follow up: Return for as needed for new, worsening, persistent symptoms.   Lab/Order associations:   ICD-10-CM   1. Sore throat  J02.9     2. Acute cough  R05.1      No orders of the defined types were placed in this encounter.  I,Jada Bradford,acting as a scribe for Tana Conch, MD.,have documented all relevant documentation on the behalf of Tana Conch, MD,as directed by  Tana Conch, MD while in the presence of Tana Conch, MD.   I, Tana Conch, MD, have reviewed all documentation for this visit. The documentation on 03/07/21 for the exam, diagnosis, procedures, and orders are all accurate and complete.   Return precautions advised.  Tana Conch, MD

## 2021-03-07 NOTE — Addendum Note (Signed)
Addended by: Jobe Gibbon on: 03/07/2021 02:44 PM   Modules accepted: Orders

## 2021-03-17 ENCOUNTER — Other Ambulatory Visit (HOSPITAL_BASED_OUTPATIENT_CLINIC_OR_DEPARTMENT_OTHER): Payer: Self-pay

## 2021-03-17 ENCOUNTER — Encounter: Payer: Self-pay | Admitting: Family Medicine

## 2021-03-17 MED ORDER — AZITHROMYCIN 250 MG PO TABS
ORAL_TABLET | ORAL | 0 refills | Status: DC
  Filled 2021-03-17: qty 6, 5d supply, fill #0

## 2021-03-20 ENCOUNTER — Other Ambulatory Visit (HOSPITAL_BASED_OUTPATIENT_CLINIC_OR_DEPARTMENT_OTHER): Payer: Self-pay

## 2021-03-21 ENCOUNTER — Ambulatory Visit (HOSPITAL_COMMUNITY): Payer: Self-pay

## 2021-05-06 ENCOUNTER — Ambulatory Visit (INDEPENDENT_AMBULATORY_CARE_PROVIDER_SITE_OTHER): Payer: 59

## 2021-05-06 ENCOUNTER — Ambulatory Visit: Payer: Self-pay

## 2021-05-06 ENCOUNTER — Other Ambulatory Visit: Payer: Self-pay

## 2021-05-06 ENCOUNTER — Ambulatory Visit (INDEPENDENT_AMBULATORY_CARE_PROVIDER_SITE_OTHER): Payer: 59 | Admitting: Family Medicine

## 2021-05-06 ENCOUNTER — Ambulatory Visit
Admission: EM | Admit: 2021-05-06 | Discharge: 2021-05-06 | Disposition: A | Discharge status: Home / Self Care | Payer: 59 | Attending: Physician Assistant | Admitting: Physician Assistant

## 2021-05-06 VITALS — BP 120/76 | HR 65 | Ht 70.0 in | Wt 212.0 lb

## 2021-05-06 DIAGNOSIS — M25561 Pain in right knee: Secondary | ICD-10-CM

## 2021-05-06 DIAGNOSIS — S8391XA Sprain of unspecified site of right knee, initial encounter: Secondary | ICD-10-CM | POA: Diagnosis not present

## 2021-05-06 IMAGING — DX DG KNEE AP/LAT W/ SUNRISE*R*
3 series · 3 of 3 positions shown · non-contrast
Comparison: None.

CLINICAL DATA: Right knee pain.

EXAM:
RIGHT KNEE 3 VIEWS

[knee ap]
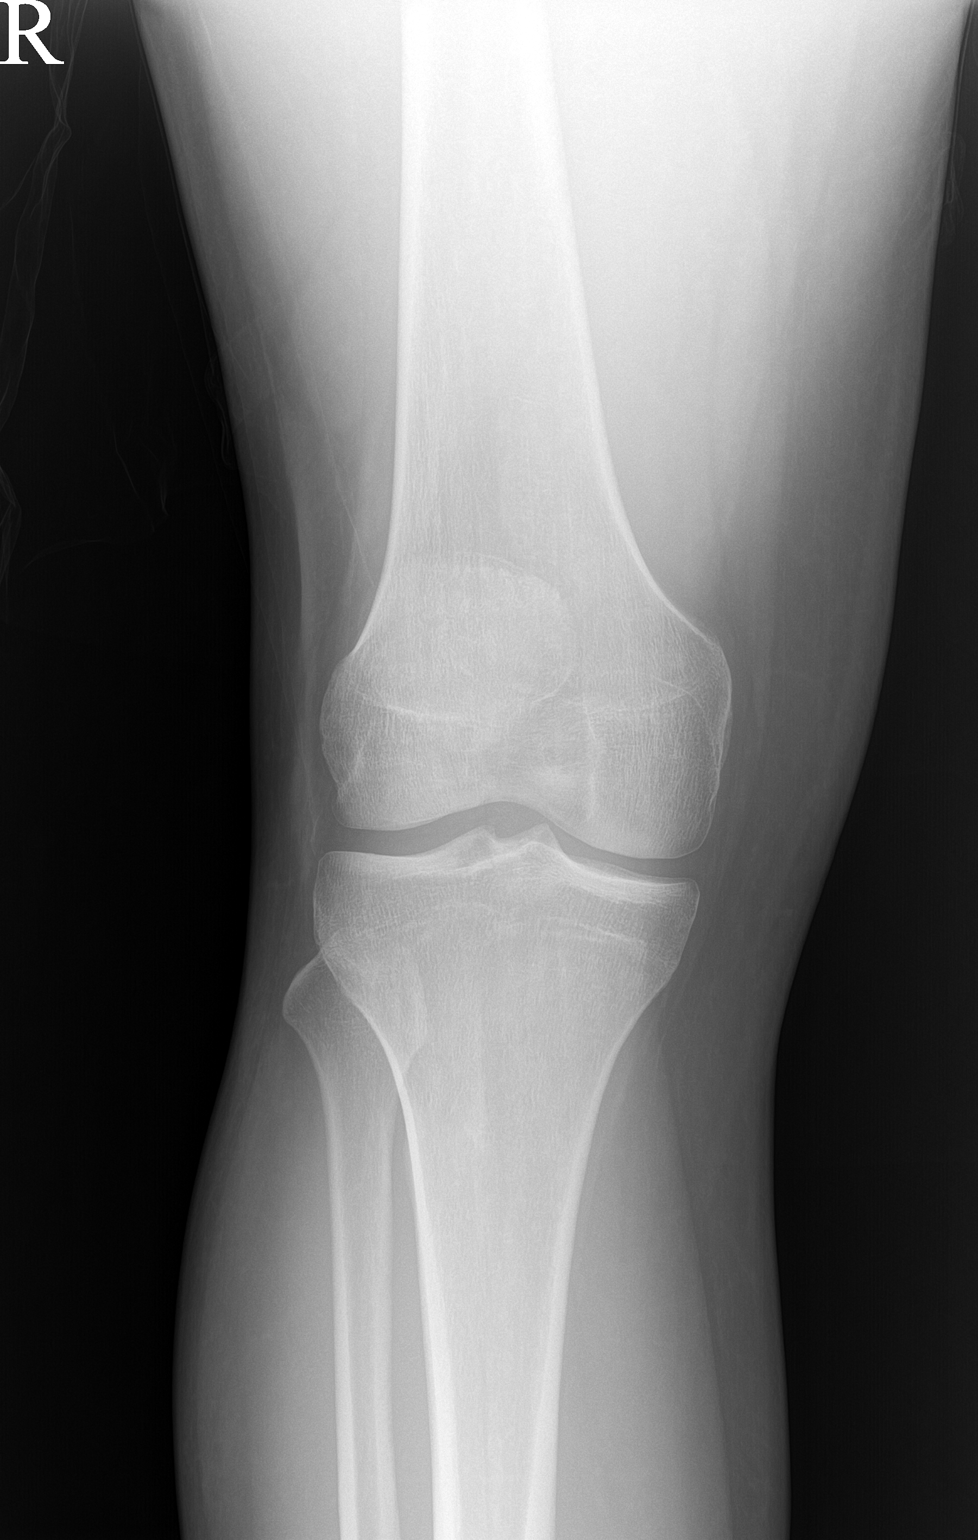

[knee lat]
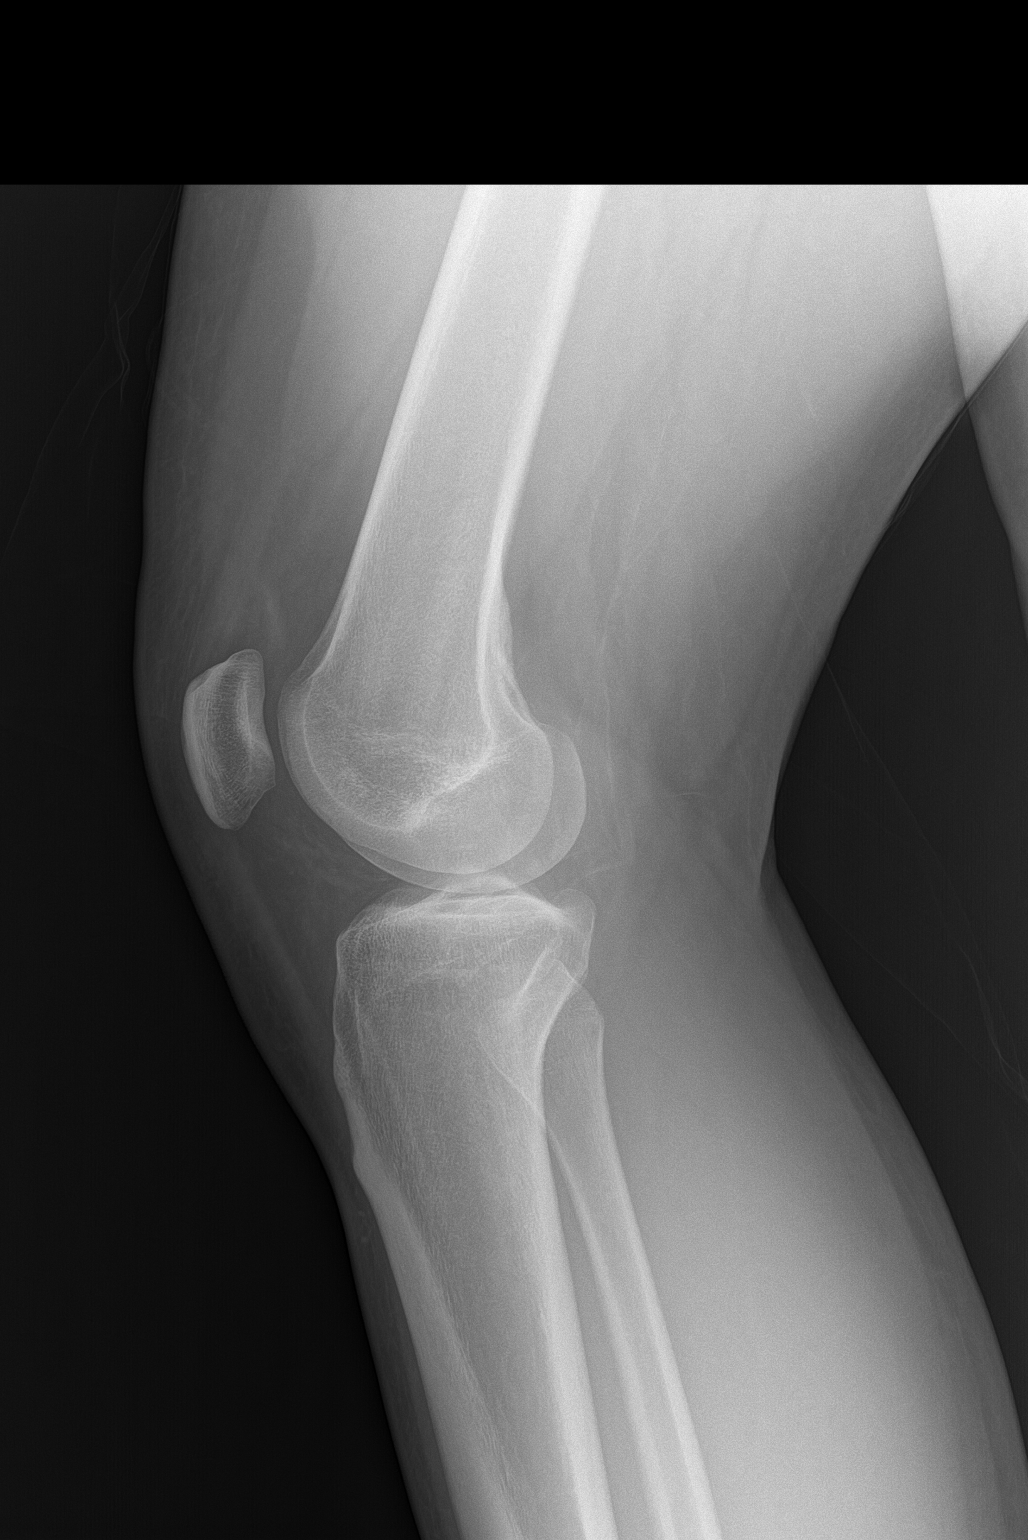

[patella]
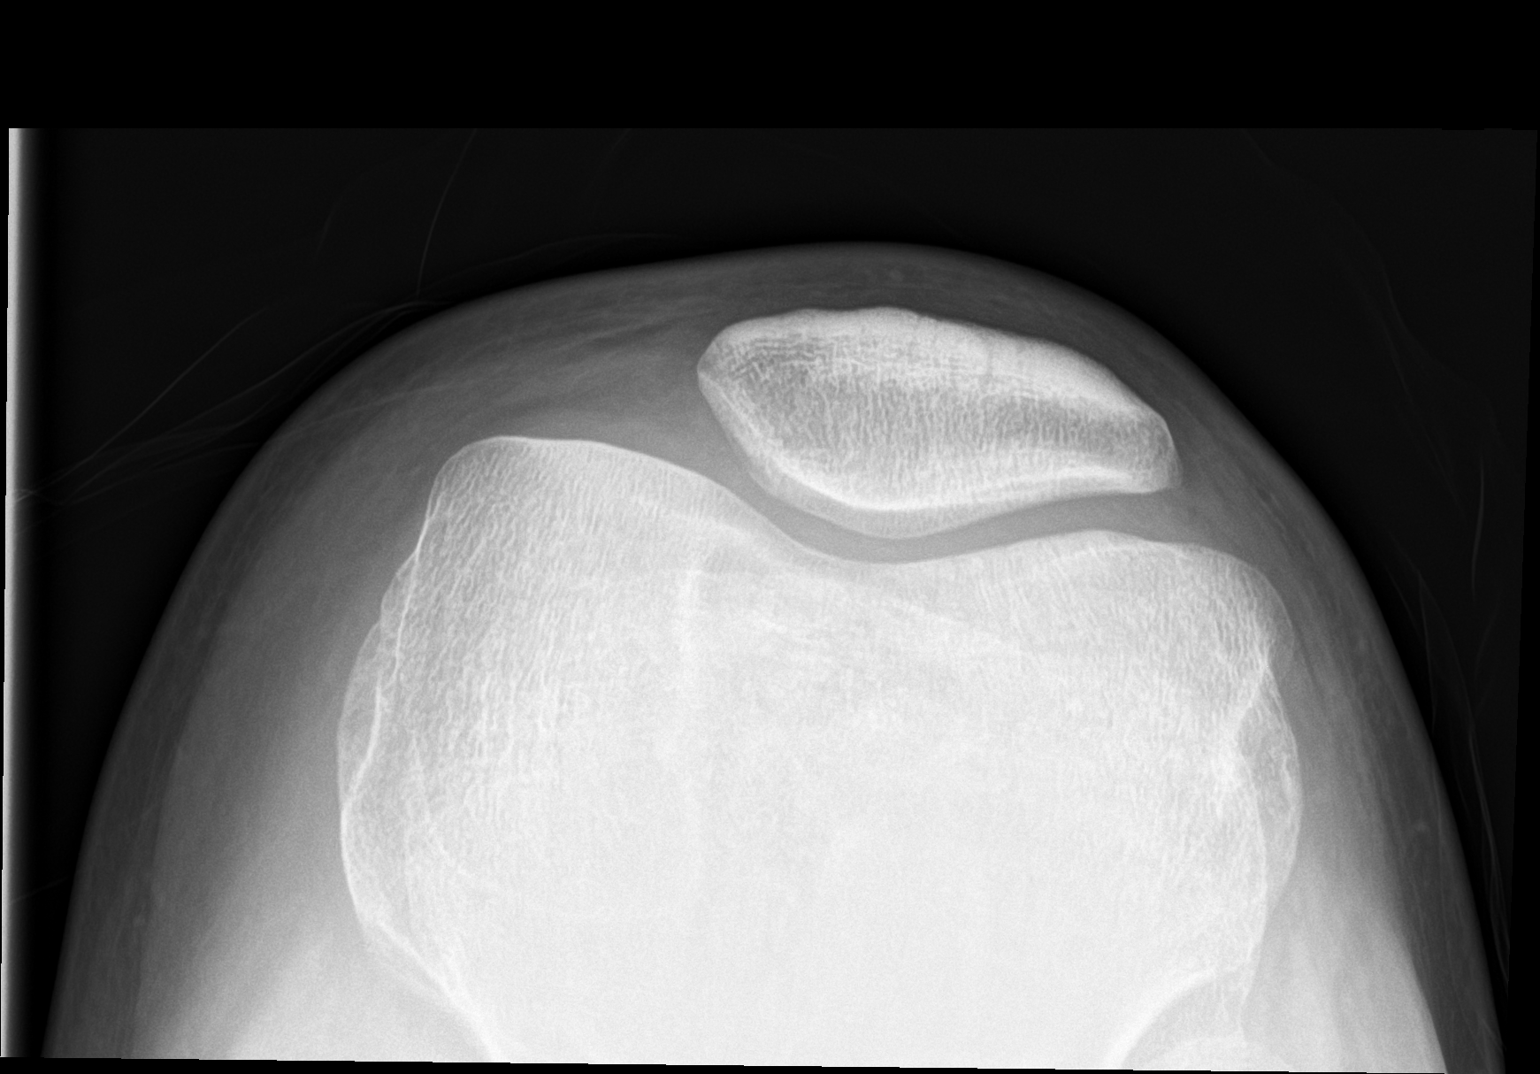

[3 of 3 positions shown; findings below may reference images not displayed]

FINDINGS: No evidence of fracture, dislocation, or joint effusion. No evidence
of arthropathy or other focal bone abnormality. Soft tissues are
unremarkable.
IMPRESSION: Negative.

## 2021-05-06 MED ORDER — MELOXICAM 15 MG PO TABS: 15.0000 mg | ORAL_TABLET | Freq: Every day | ORAL | 0 refills | Status: DC

## 2021-05-06 NOTE — Patient Instructions (Addendum)
Thank you for coming in today.   Take the Meloxicam for 2 weeks.  You should hear from MRI scheduling within 1 week. If you do not hear please let me know.    Recheck back after MRI

## 2021-05-06 NOTE — ED Triage Notes (Signed)
Pt reports feeling a pop in his right knee at work (over the weekend) that is now causing pain. Paitent is ambulatory.

## 2021-05-06 NOTE — Discharge Instructions (Addendum)
Use crutches to ambulate - ambulate as tolerated Wear ace wrap Follow up with ortho Take ibuprofen or tylenol as needed

## 2021-05-06 NOTE — Progress Notes (Signed)
I, Christoper Fabian, LAT, ATC, am serving as scribe for Dr. Clementeen Graham.  Scott Durham is a 21 y.o. male who presents to [facility] today for R knee pain.  He was last seen by Dr. Denyse Amass on 07/22/20 for B hand pain.  Today, pt reports R knee pain since 05/05/21 when his R knee gave out while at work on Saturday, 1/28, and he hit his knee on the ground.  He is not sure if he slipped or twisted his knee or it just gave out.  Pt works at [employer]. He locates pain to mostly the anterior, medial, and lateral aspects of the R knee.  He has a hx of a prior R knee meniscus tear per pt, about 5 years ago.  He was seen at the [facility] earlier today and advised to use crutches, a compression wrap and Tyelnol/IBU.  He notes popping and clicking and feeling of locking sensation within his knee.  R knee swelling: yes R knee mechanical symptoms: yes Aggravating factors: standing, trying to work Treatments tried: compression wrap, ice, elevation, using crutches   Pertinent review of systems: No fevers or chills  Relevant historical information: No knee injury history   Exam:  BP 120/76    Pulse 65    Ht 5\' 10"  (1.778 m)    Wt 212 lb (96.2 kg)    SpO2 97%    BMI 30.42 kg/m  General: Well Developed, well nourished, and in no acute distress.   MSK: Right knee mild effusion normal-appearing otherwise Normal motion with crepitation. Tender palpation medial joint line. Stable ligamentous exam. Positive medial McMurray's test. Strength decreased to extension 4/5.  Intact flexion 5/5. Antalgic gait   Lab and Radiology Results  Diagnostic Limited MSK Ultrasound of: Right knee Quad tendon intact normal. Patellar tendon normal appearing. Lateral joint line normal  Appearing. Medial joint line no definitive meniscus tear visible. Posterior knee no Baker's cyst. Impression: Largely normal-appearing knee to ultrasound examination.  X-ray images right knee  obtained today personally and independently interpreted. No acute fractures. No significant degenerative changes. Await formal radiology review    Assessment and Plan: 21 y.o. male with right knee pain occurring due to an injury at work a few days ago.  Concerning for a medial meniscus tear with mechanical symptoms.  Given his significant dysfunction proceed to MRI to further evaluate source of pain and dysfunction and for potential surgical planning.  Recommend hinged knee brace and will prescribe meloxicam for pain control.  Work note for now.  Recheck back after MRI.   PDMP not reviewed this encounter. Orders Placed This Encounter  Procedures   DG Knee AP/LAT W/Sunrise Right    Standing Status:   Future    Number of Occurrences:   1    Standing Expiration Date:   05/06/2022    Order Specific Question:   Reason for Exam (SYMPTOM  OR DIAGNOSIS REQUIRED)    Answer:   right knee pain    Order Specific Question:   Preferred imaging location?    Answer:   05/08/2022 [facility] LIMITED JOINT SPACE STRUCTURES LOW RIGHT(NO LINKED CHARGES)    Standing Status:   Future    Number of Occurrences:   1    Standing Expiration Date:   11/03/2021    Order Specific Question:   Reason for Exam (SYMPTOM  OR DIAGNOSIS REQUIRED)    Answer:   right knee pain  Order Specific Question:   Preferred imaging location?    Answer:   [facility]   MR Knee Left  Wo Contrast    R knee w/ mechanical symptoms    Standing Status:   Future    Standing Expiration Date:   05/06/2022    Order Specific Question:   What is the patient's sedation requirement?    Answer:   No Sedation    Order Specific Question:   Does the patient have a pacemaker or implanted devices?    Answer:   No    Order Specific Question:   Preferred imaging location?    Answer:   [facility] (table limit-350lbs)   Meds ordered this encounter  Medications   meloxicam (MOBIC) 15 MG tablet    Sig: Take 1  tablet (15 mg total) by mouth daily.    Dispense:  14 tablet    Refill:  0     Discussed warning signs or symptoms. Please see discharge instructions. Patient expresses understanding.   The above documentation has been reviewed and is accurate and complete Clementeen Graham, M.D.

## 2021-05-06 NOTE — ED Provider Notes (Signed)
[facility]    CSN: [id number] Arrival date & time: 05/06/21  0801      History   Chief Complaint Chief Complaint  Patient presents with   Knee Pain    HPI Scott Durham is a 21 y.o. male.   Patient here c/w R knee pain x 1 day.  He was at work when he felt his knee give out on him, he fell and hit his knee on the ground.  He is able ot bear weight with pain.  He denies ecchymosis, RROM, weakness, n/t.  He reports he has torn meniscus from prior injury that was not repaired.   Past Medical History:  Diagnosis Date   Migraine    saw neurology in teenage years- now off all medicines. had been on amitriptyline, sumatriptan, and nauseas medicine     Patient Active Problem List   Diagnosis Date Noted   Migraine    Allergic rhinitis 07/03/2015   Acne vulgaris 07/03/2015    Past Surgical History:  Procedure Laterality Date   APPENDECTOMY     CHOLECYSTECTOMY     TONSILLECTOMY AND ADENOIDECTOMY         Home Medications    Prior to Admission medications   Medication Sig Start Date End Date Taking? Authorizing Provider  azithromycin (ZITHROMAX) 250 MG tablet Take 2 tablets on day 1, then 1 tabley daily until finished 03/17/21   Marin Olp, MD  omeprazole (PRILOSEC) 40 MG capsule TAKE 1 CAPSULE (40 MG TOTAL) BY MOUTH DAILY. Patient not taking: Reported on 03/07/2021 06/06/20 06/06/21  Marin Olp, MD  gabapentin (NEURONTIN) 300 MG capsule Take 1 capsule (300 mg total) by mouth 3 (three) times daily as needed (nerve pain). 01/09/20 05/08/20  Gregor Hams, MD  promethazine (PHENERGAN) 25 MG tablet Take 25 mg by mouth every 6 (six) hours as needed for nausea or vomiting.  12/31/19  [provider]    Family History Family History  Problem Relation Age of Onset   Hiatal hernia Mother    GER disease Mother    Other Father        unknown history   Hyperlipidemia Maternal Grandmother    Hyperlipidemia Maternal Grandfather    Aortic aneurysm Paternal  Grandmother        had surgery   Other Paternal Grandfather        back pain issue,    Cancer - Other Paternal Grandfather     Social History Social History   Tobacco Use   Smoking status: Never   Smokeless tobacco: Never  Vaping Use   Vaping Use: Never used  Substance Use Topics   Alcohol use: No   Drug use: Yes    Types: Marijuana     Allergies   Penicillins, Benzoyl peroxide, and Adhesive [tape]   Review of Systems Review of Systems  Constitutional:  Negative for chills, fatigue and fever.  Musculoskeletal:  Positive for arthralgias, gait problem, joint swelling and myalgias.  Skin:  Negative for color change.  Neurological:  Negative for weakness and numbness.  Hematological:  Negative for adenopathy. Does not bruise/bleed easily.  Psychiatric/Behavioral:  Negative for sleep disturbance.     Physical Exam Triage Vital Signs ED Triage Vitals  Enc Vitals Group     BP 05/06/21 0810 120/76     Pulse Rate 05/06/21 0810 65     Resp 05/06/21 0810 18     Temp 05/06/21 0810 97.7 F (36.5 C)     Temp  Source 05/06/21 0810 Oral     SpO2 05/06/21 0810 97 %     Weight --      Height --      Head Circumference --      Peak Flow --      Pain Score 05/06/21 0809 5     Pain Loc --      Pain Edu? --      Excl. in GC? --    No data found.  Updated Vital Signs BP 120/76 (BP Location: Right Arm)    Pulse 65    Temp 97.7 F (36.5 C) (Oral)    Resp 18    SpO2 97%   Visual Acuity Right Eye Distance:   Left Eye Distance:   Bilateral Distance:    Right Eye Near:   Left Eye Near:    Bilateral Near:     Physical Exam Vitals and nursing note reviewed.  Constitutional:      General: He is not in acute distress.    Appearance: Normal appearance. He is not ill-appearing.  HENT:     Head: Normocephalic and atraumatic.  Eyes:     General: No scleral icterus.    Extraocular Movements: Extraocular movements intact.     Conjunctiva/sclera: Conjunctivae normal.   Pulmonary:     Effort: Pulmonary effort is normal. No respiratory distress.  Musculoskeletal:     Cervical back: Normal range of motion. No rigidity.     Right knee: Swelling present. No effusion, ecchymosis, lacerations, bony tenderness or crepitus. Normal range of motion. Tenderness present over the MCL and LCL. No LCL laxity or MCL laxity. Abnormal meniscus.     Instability Tests: Medial McMurray test positive. Lateral McMurray test negative.  Skin:    Capillary Refill: Capillary refill takes less than 2 seconds.     Coloration: Skin is not jaundiced.     Findings: No rash.  Neurological:     General: No focal deficit present.     Mental Status: He is alert and oriented to person, place, and time.     Motor: No weakness.     Gait: Gait abnormal (mildly favoring L leg).  Psychiatric:        Mood and Affect: Mood normal.        Behavior: Behavior normal.     UC Treatments / Results  Labs (all labs ordered are listed, but only abnormal results are displayed) Labs Reviewed - No data to display  EKG   Radiology No results found.  Procedures Procedures (including critical care time)  Medications Ordered in UC Medications - No data to display  Initial Impression / Assessment and Plan / UC Course  I have reviewed the triage vital signs and the nursing notes.  Pertinent labs & imaging results that were available during my care of the patient were reviewed by me and considered in my medical decision making (see chart for details).     Use crutches to ambulate, weight bear as tolerated Follow up with ortho Follow RICE therapy Final Clinical Impressions(s) / UC Diagnoses   Final diagnoses:  Sprain of right knee, unspecified ligament, initial encounter     Discharge Instructions      Use crutches to ambulate - ambulate as tolerated Wear ace wrap Follow up with ortho Take ibuprofen or tylenol as needed       ED Prescriptions   None    PDMP not reviewed  this encounter.   Evern CoreLindquist, Alexsandria Kivett, PA-C 05/06/21 [phone]

## 2021-05-07 NOTE — Progress Notes (Signed)
Right knee x-ray looks normal to radiology

## 2021-05-11 ENCOUNTER — Other Ambulatory Visit: Payer: Self-pay

## 2021-05-11 ENCOUNTER — Ambulatory Visit (INDEPENDENT_AMBULATORY_CARE_PROVIDER_SITE_OTHER): Payer: 59

## 2021-05-11 DIAGNOSIS — M25561 Pain in right knee: Secondary | ICD-10-CM | POA: Diagnosis not present

## 2021-05-11 IMAGING — MR MR KNEE*R* W/O CM
8 series · 40 of 40 positions shown · non-contrast
Comparison: Plain films right knee [DATE].

CLINICAL DATA: Right knee pain, swelling weakness since an injury
lifting boxes [DATE]. Subsequent encounter.

EXAM:
MRI OF THE RIGHT KNEE WITHOUT CONTRAST
TECHNIQUE: Multiplanar, multisequence MR imaging of the knee was performed. No
intravenous contrast was administered.

[Series 3: T2 fat-sat · axial · 4.0mm · 0.50mm/px · z∈[-73,+80]mm · 6 of 33 slices shown (1 of 4)]
[im 1/33]
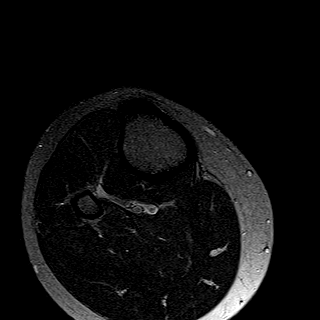
[im 7/33]
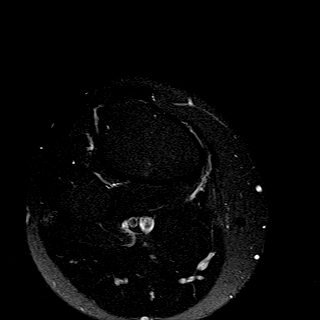
[im 13/33]
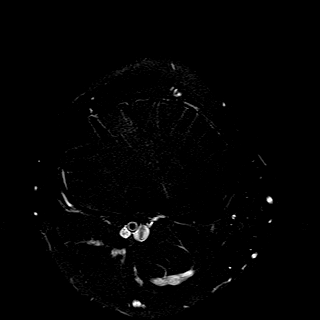
[im 20/33]
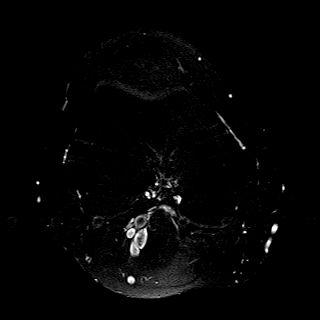
[im 26/33]
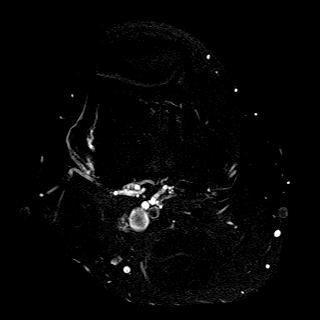
[im 33/33]
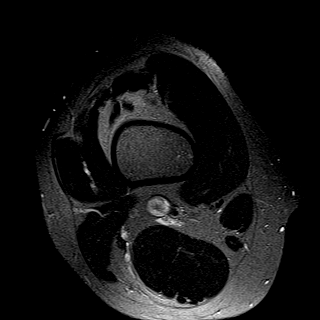

[Series 4: T1 · coronal · 4.0mm · 0.59mm/px · 5 of 28 slices shown]
[im 1/28]
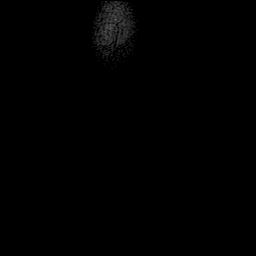
[im 7/28]
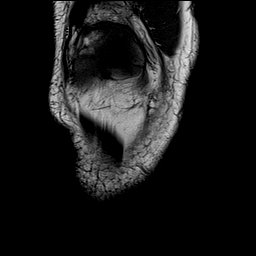
[im 14/28]
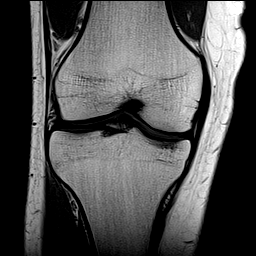
[im 21/28]
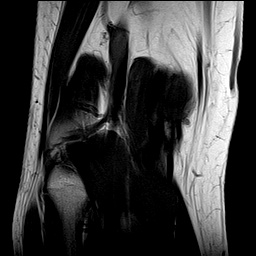
[im 28/28]
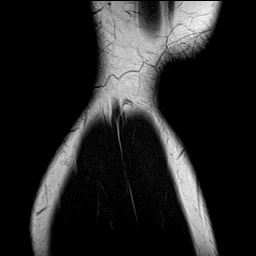

[Series 5: T2 fat-sat · coronal · 4.0mm · 0.59mm/px · 5 of 28 slices shown (2 of 4)]
[im 1/28]
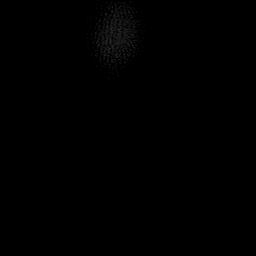
[im 7/28]
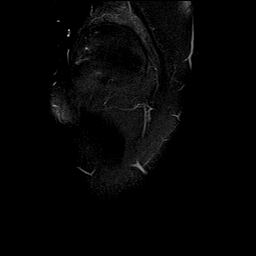
[im 14/28]
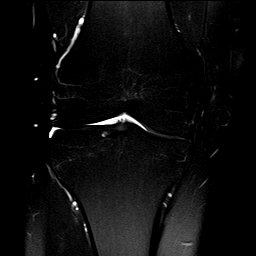
[im 21/28]
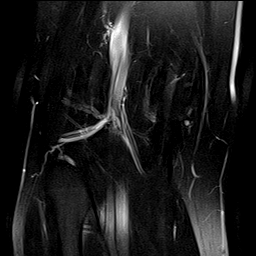
[im 28/28]
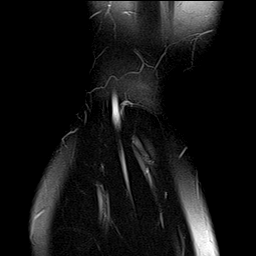

[Series 6: PD fat-sat · coronal · 4.0mm · 0.59mm/px · 5 of 28 slices shown (1 of 3)]
[im 1/28]
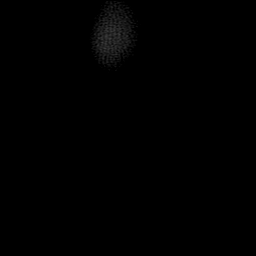
[im 7/28]
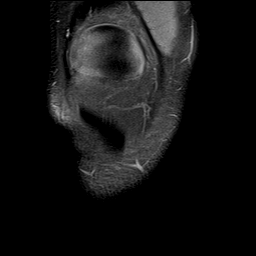
[im 14/28]
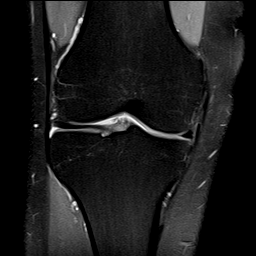
[im 21/28]
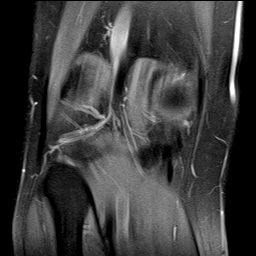
[im 28/28]
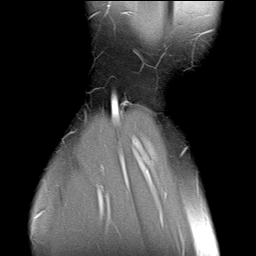

[Series 7: PD fat-sat · sagittal · 3.0mm · 0.59mm/px · 5 of 32 slices shown (2 of 3)]
[im 1/32]
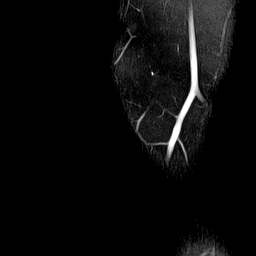
[im 8/32]
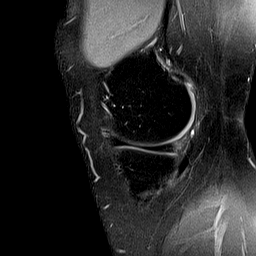
[im 16/32]
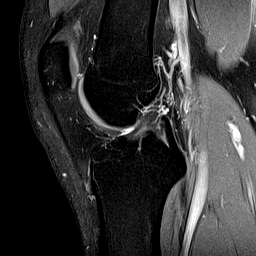
[im 24/32]
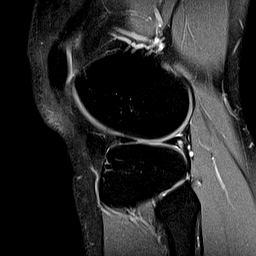
[im 32/32]
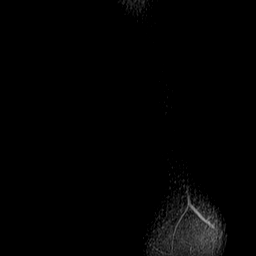

[Series 8: T2 fat-sat · sagittal · 3.0mm · 0.59mm/px · 5 of 32 slices shown (3 of 4)]
[im 1/32]
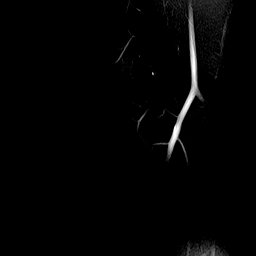
[im 8/32]
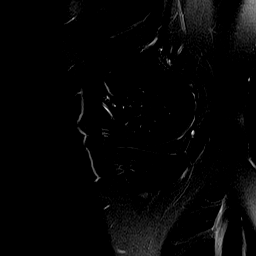
[im 16/32]
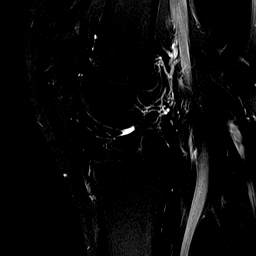
[im 24/32]
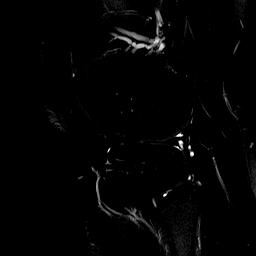
[im 32/32]
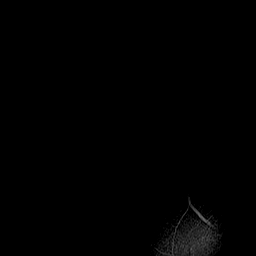

[Series 9: PD fat-sat · coronal · 2.0mm · 0.59mm/px · 4 of 24 slices shown (3 of 3)]
[im 1/24]
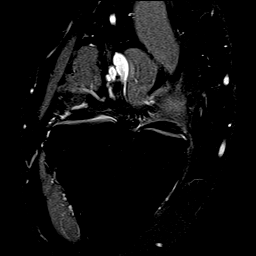
[im 8/24]
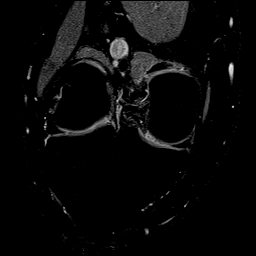
[im 16/24]
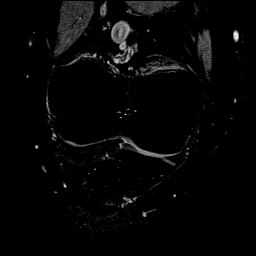
[im 24/24]
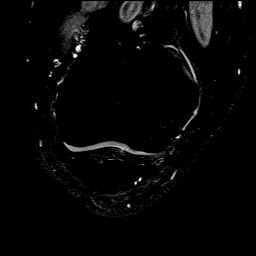

[Series 10: T2 fat-sat · axial · 4.0mm · 0.62mm/px · z∈[-58,+76]mm · 5 of 29 slices shown (4 of 4)]
[im 1/29]
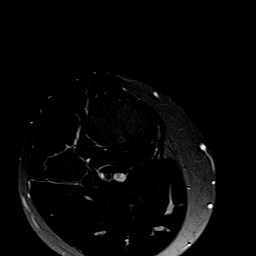
[im 8/29]
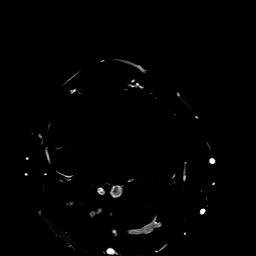
[im 15/29]
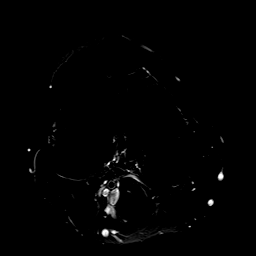
[im 22/29]
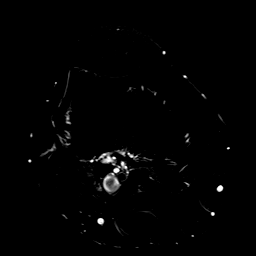
[im 29/29]
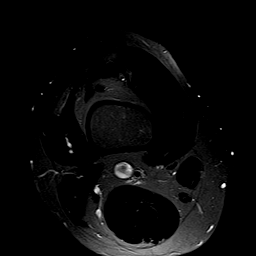

[40 of 40 positions shown; findings below may reference images not displayed]

FINDINGS: MENISCI

Medial meniscus:  Intact.

Lateral meniscus:  Intact.

LIGAMENTS

Cruciates:  Intact.

Collaterals:  Intact.

CARTILAGE

Patellofemoral:  Normal.

Medial:  Normal.

Lateral:  Normal.

Joint:  No effusion.

Popliteal Fossa:  No Baker's cyst.

Extensor Mechanism:  Intact.

Bones:  Normal marrow signal throughout.

Other: None.
IMPRESSION: Normal MRI right knee.

## 2021-05-12 NOTE — Progress Notes (Signed)
Knee MRI shows no ligament or meniscus tears.  It is a normal looking MRI.  If you still hurt please return to clinic to go over the results and potentially proceed with cortisone shot of the knee.

## 2021-06-10 ENCOUNTER — Telehealth: Payer: Self-pay | Admitting: Family Medicine

## 2021-06-10 NOTE — Telephone Encounter (Signed)
Can check with dentist-if dentist does not want to prescribe this would schedule patient a visit with me or another provider to evaluate ?

## 2021-06-10 NOTE — Telephone Encounter (Signed)
Should pt get this antibiotic from his dentist? ?

## 2021-06-10 NOTE — Telephone Encounter (Signed)
Pt's mother states pt is having a root canal next week but he is having some swelling in the gum area. Possible infection. She is wanting to know if [facility] can order an antibiotic for him. Please advise. ?

## 2021-06-11 NOTE — Telephone Encounter (Signed)
Called and spoke with pt's mother. She will call back to schedule an appt.  ?

## 2021-06-11 NOTE — Telephone Encounter (Signed)
See below regarding scheduling. ?

## 2021-06-26 ENCOUNTER — Other Ambulatory Visit: Payer: Self-pay

## 2021-06-26 ENCOUNTER — Ambulatory Visit (INDEPENDENT_AMBULATORY_CARE_PROVIDER_SITE_OTHER): Payer: 59

## 2021-06-26 ENCOUNTER — Ambulatory Visit (INDEPENDENT_AMBULATORY_CARE_PROVIDER_SITE_OTHER): Payer: 59 | Admitting: Sports Medicine

## 2021-06-26 VITALS — BP 136/80 | HR 94 | Ht 70.0 in | Wt 212.0 lb

## 2021-06-26 DIAGNOSIS — M79672 Pain in left foot: Secondary | ICD-10-CM

## 2021-06-26 DIAGNOSIS — S93492A Sprain of other ligament of left ankle, initial encounter: Secondary | ICD-10-CM

## 2021-06-26 IMAGING — DX DG FOOT COMPLETE 3+V*L*
3 series · 3 of 3 positions shown · non-contrast
Comparison: X-ray [DATE].

CLINICAL DATA: Left dorsal foot pain, numbness throughout the toes.
Initial encounter

EXAM:
LEFT FOOT - COMPLETE 3+ VIEW

[foot ap]
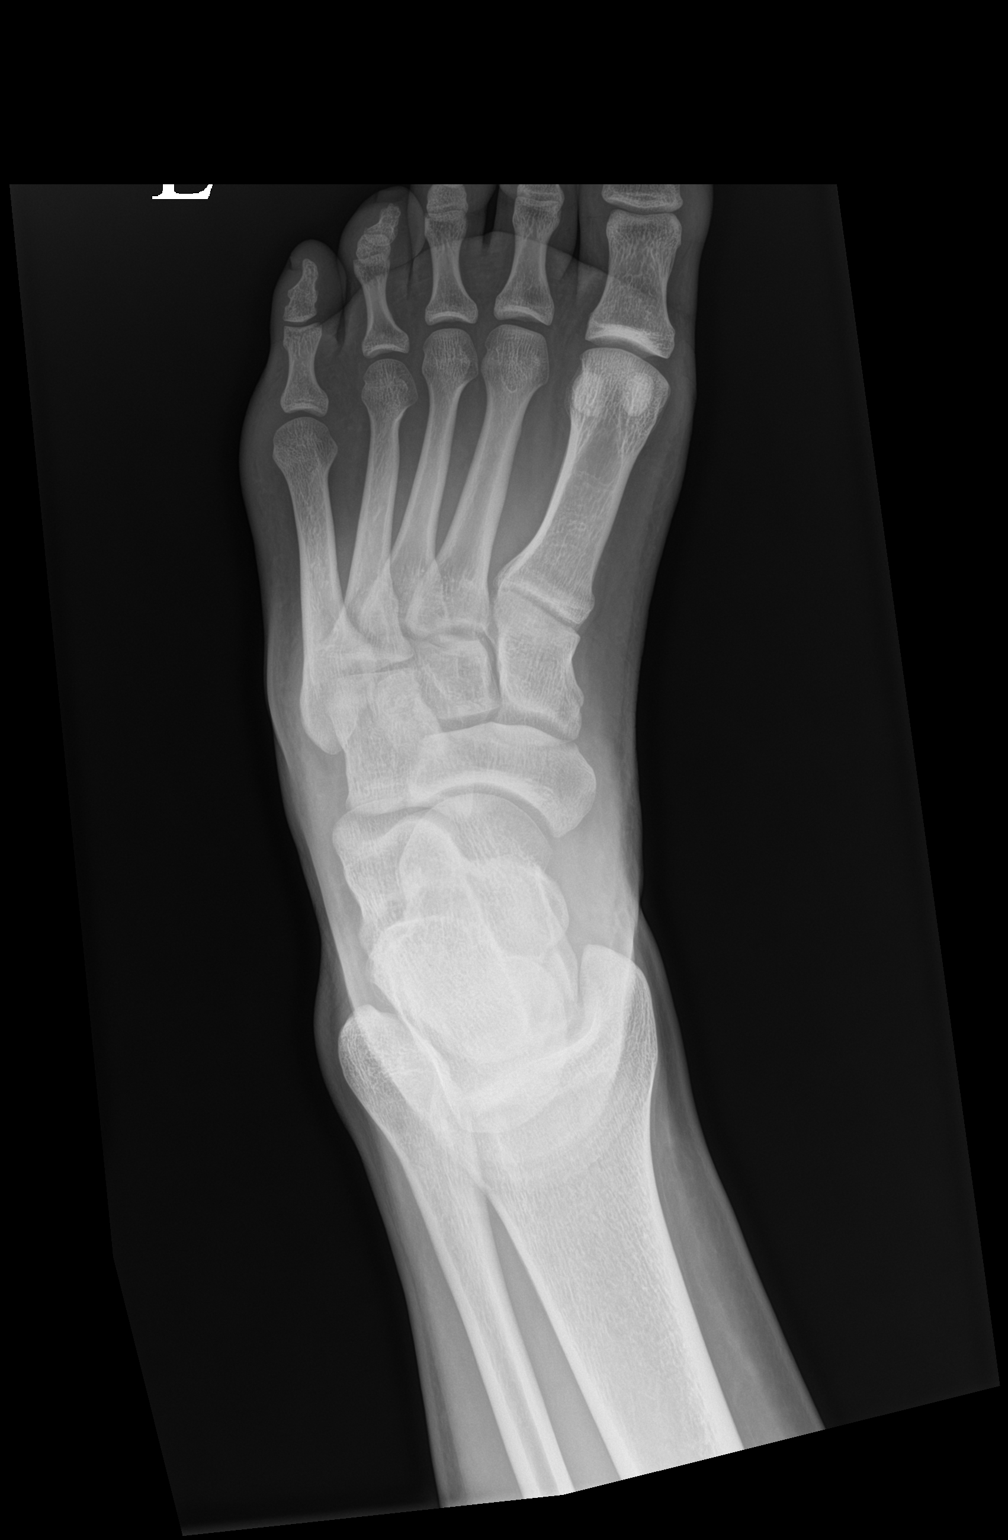

[foot obl]
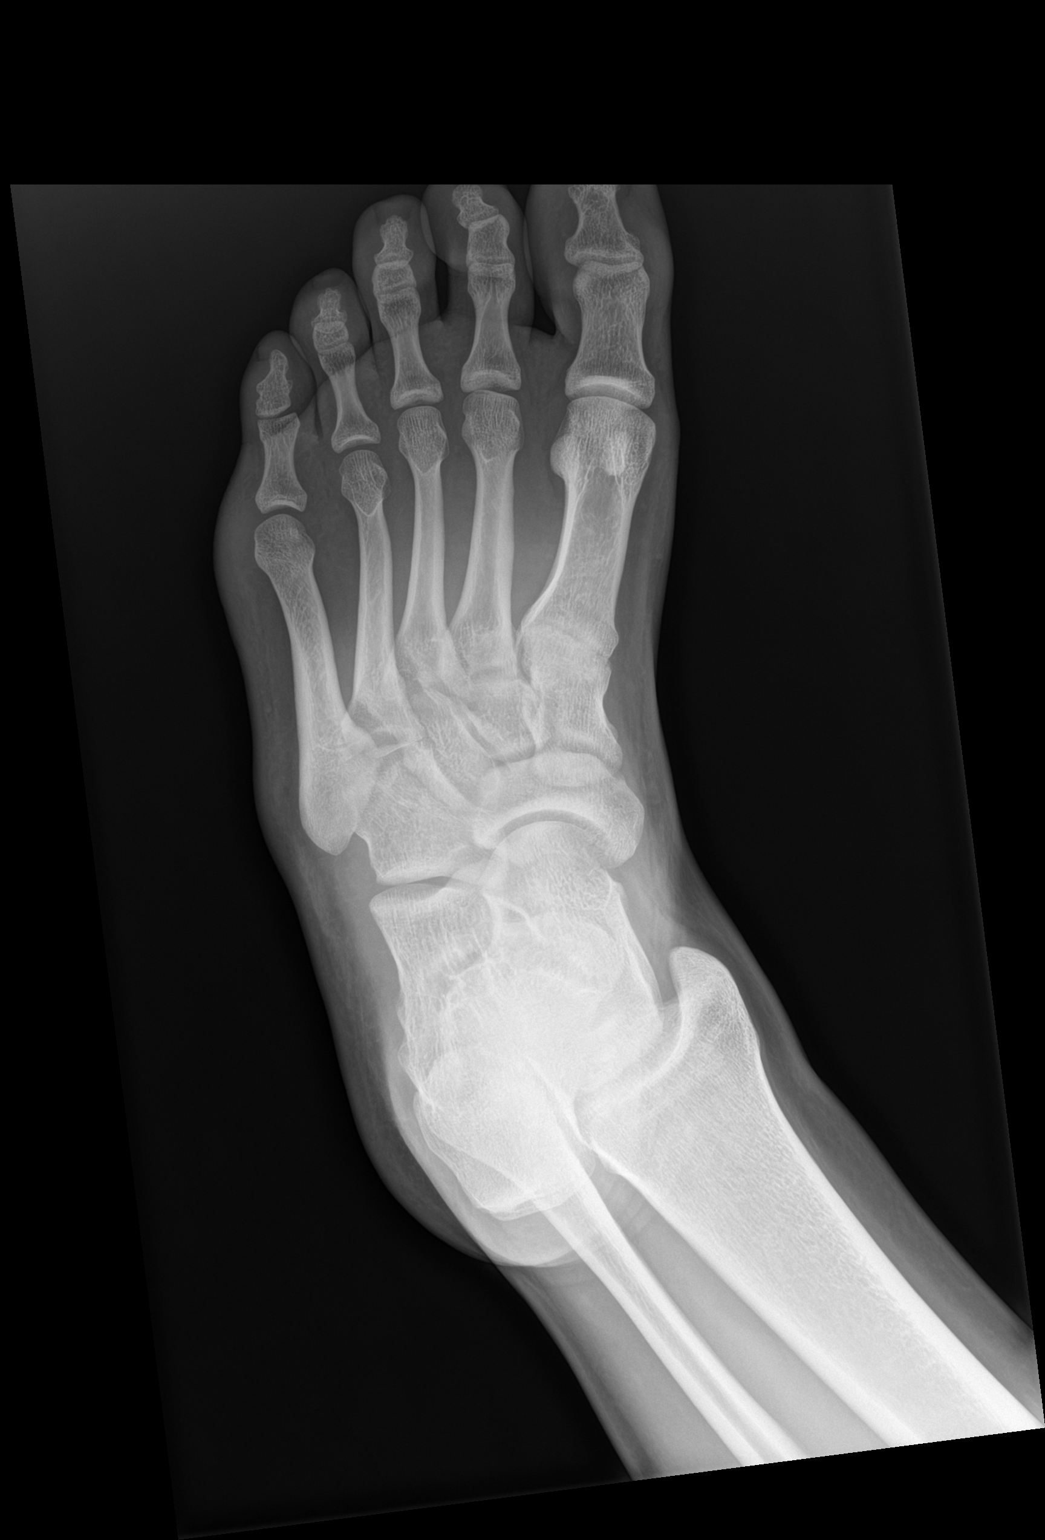

[foot lat]
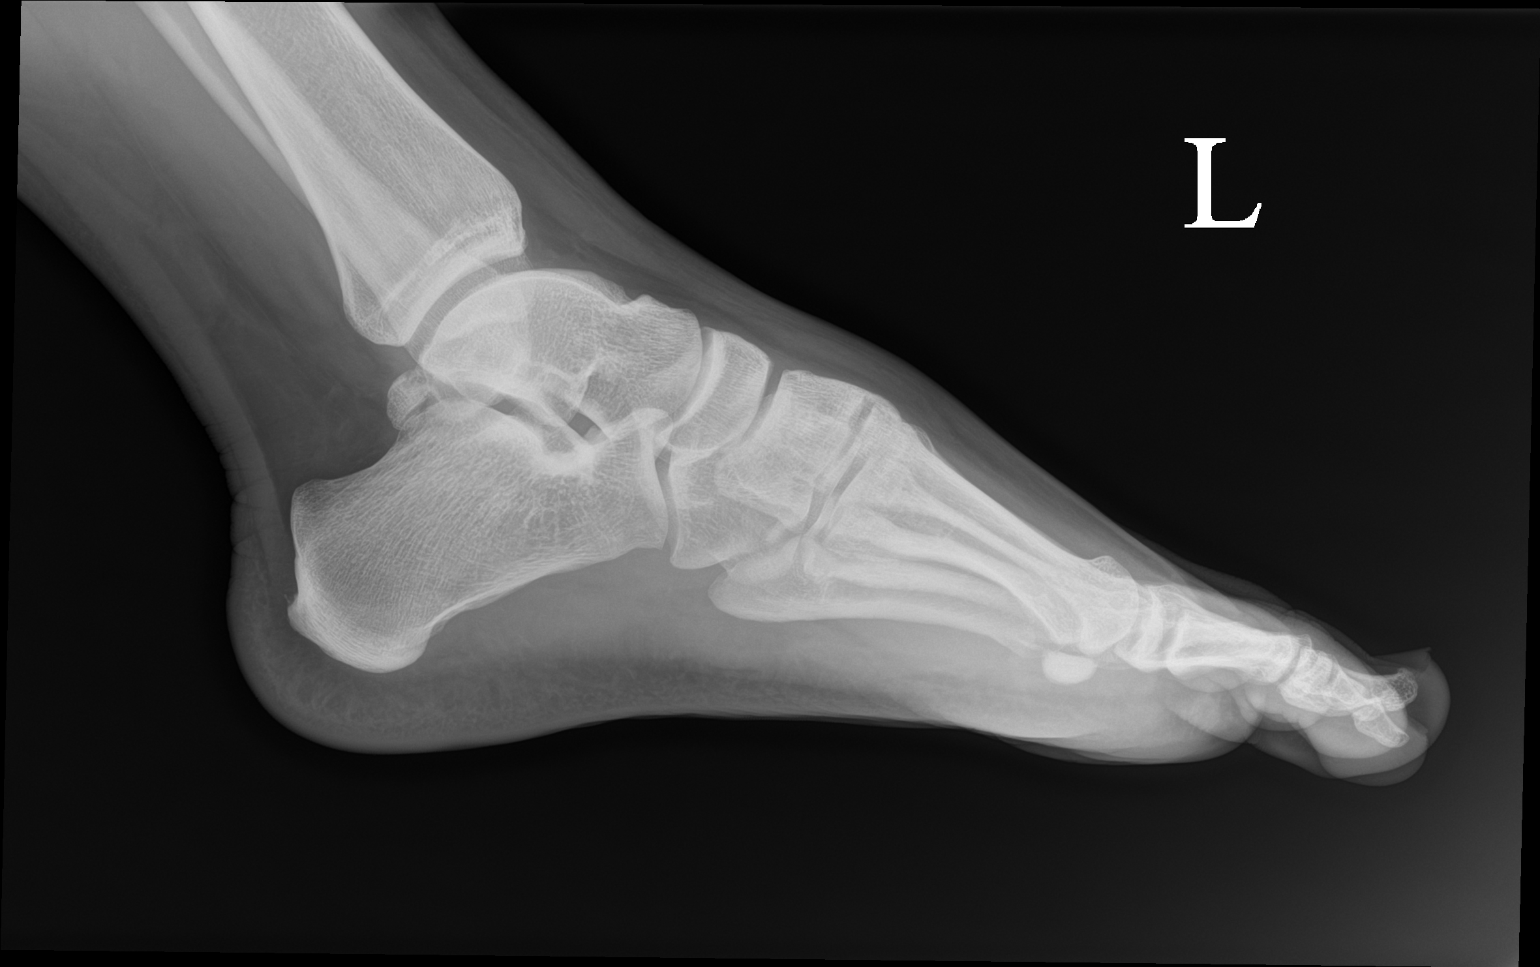

[3 of 3 positions shown; findings below may reference images not displayed]

FINDINGS: There is no evidence of fracture or dislocation. There is no
evidence of arthropathy or other focal bone abnormality. Soft
tissues are unremarkable.
IMPRESSION: No acute abnormality noted.

## 2021-06-26 MED ORDER — MELOXICAM 15 MG PO TABS: 15.0000 mg | ORAL_TABLET | Freq: Every day | ORAL | 0 refills | Status: DC

## 2021-06-26 NOTE — Progress Notes (Signed)
? ?   Scott Durham ?[facility] ?[place] ?Phone: [phone] ?  ?Assessment and Plan:   ?  ?1. Left foot pain ?2. High ankle sprain of left lower extremity, initial encounter ?-Acute, uncomplicated, initial sports medicine visit ?-Lateral ankle sprain likely grade 2 versus grade 3 based on swelling, physical exam, HPI ?- Recommend nonweightbearing using crutches due to pain with weightbearing at present time.  Patient will be provided with postop shoe that he may gradually transition to using a set of crutches as tolerated with goal of pain-free weightbearing ?- Start meloxicam 15 mg daily x2 weeks.  If still having pain after 2 weeks, complete 3rd-week of meloxicam. May use remaining meloxicam as needed once daily for pain control.  Do not to use additional NSAIDs while taking meloxicam.  May use Tylenol [phone] mg 2 to 3 times a day for breakthrough pain. ?- Start RICE therapy ?-X-ray obtained in clinic.  My interpretation: No acute fracture or dislocation ? ?Pertinent previous records reviewed include none ?  ?Follow Up: 2 weeks for reevaluation.  If patient is still having bony tenderness, would consider repeat x-ray to evaluate for delayed fracture appearance.  If improving would progress activity ?  ?Subjective:   ?I, Scott Durham, am serving as a Education administrator for Doctor [employer] ? ?Chief Complaint: foot pain  ? ?HPI:  ? ?06/26/21 ?Patient is a 21 year old male complaining of foot pain. Patient states that last night he was running and he rolled over on his foot immediate bruising and swelling isnt able to put pressure on it , is getting numbness and tingling , pain in his medial foot/ arch and toes no radiating pain , no meds , iced but didn't help  ? ?Relevant Historical Information: None pertinent ? ?Additional pertinent review of systems negative. ? ? ?Current Outpatient Medications:  ?  meloxicam (MOBIC) 15 MG tablet, Take 1 tablet (15  mg total) by mouth daily., Disp: 14 tablet, Rfl: 0 ?  meloxicam (MOBIC) 15 MG tablet, Take 1 tablet (15 mg total) by mouth daily., Disp: 30 tablet, Rfl: 0 ?  omeprazole (PRILOSEC) 40 MG capsule, TAKE 1 CAPSULE (40 MG TOTAL) BY MOUTH DAILY. (Patient not taking: Reported on 03/07/2021), Disp: 30 capsule, Rfl: 1  ? ?Objective:   ?  ?Vitals:  ? 06/26/21 0924  ?BP: 136/80  ?Pulse: 94  ?SpO2: 98%  ?Weight: 212 lb (96.2 kg)  ?Height: 5\' 10"  (1.778 m)  ?  ?  ?Body mass index is 30.42 kg/m?.  ?  ?Physical Exam:   ? ?Gen: Appears well, nad, nontoxic and pleasant ?Psych: Alert and oriented, appropriate mood and affect ?Neuro: sensation intact, strength is 5/5 with df/pf/inv/ev, muscle tone wnl ?Skin: no susupicious lesions or rashes ? ?Left ankle: no deformity, no swelling or effusion ?TTP significantly ATFL, base of fifth, and moderately TTP first metatarsal and midfoot ?NTTP over fibular head, lat mal, medial mal, achilles, navicular, base of 5th,  , CFL, deltoid, calcaneous   ?ROM DF 30, PF 45, inv/ev limited by pain ?Negative ant drawer,   ?Rotation and squeeze test positive ?Neg thompson ?  pain with resisted inversion or eversion  ? ? ?Electronically signed by:  ?Scott Durham ?[facility] ?9:51 AM 06/26/21 ?

## 2021-06-26 NOTE — Patient Instructions (Addendum)
Good to see you  ?- Start meloxicam 15 mg daily x2 weeks.  If still having pain after 2 weeks, complete 3rd-week of meloxicam. May use remaining meloxicam as needed once daily for pain control.  Do not to use additional NSAIDs while taking meloxicam.  May use Tylenol [phone] mg 2 to 3 times a day for breakthrough pain. ?Post op shoe ?2 week follow up  ? ?

## 2021-07-10 ENCOUNTER — Ambulatory Visit: Payer: 59 | Admitting: Sports Medicine

## 2021-09-12 ENCOUNTER — Ambulatory Visit (INDEPENDENT_AMBULATORY_CARE_PROVIDER_SITE_OTHER): Payer: 59 | Admitting: Sports Medicine

## 2021-09-12 VITALS — BP 126/84 | HR 77 | Ht 70.0 in | Wt 215.0 lb

## 2021-09-12 DIAGNOSIS — M79641 Pain in right hand: Secondary | ICD-10-CM

## 2021-09-12 DIAGNOSIS — R200 Anesthesia of skin: Secondary | ICD-10-CM | POA: Diagnosis not present

## 2021-09-12 DIAGNOSIS — G5603 Carpal tunnel syndrome, bilateral upper limbs: Secondary | ICD-10-CM

## 2021-09-12 DIAGNOSIS — M79642 Pain in left hand: Secondary | ICD-10-CM

## 2021-09-12 MED ORDER — MELOXICAM 15 MG PO TABS: 15.0000 mg | ORAL_TABLET | Freq: Every day | ORAL | 0 refills | Status: DC

## 2021-09-12 NOTE — Progress Notes (Signed)
[facility] [address] [place] [address] Phone: [phone]   Assessment and Plan:     1. Bilateral hand pain 2. Bilateral carpal tunnel syndrome 3. Bilateral hand numbness -Chronic with exacerbation, subsequent sports medicine visit - Unclear etiology of bilateral hand pain, numbness and tingling.  Symptoms are most consistent with bilateral carpal tunnel, though are not completely consistent with this condition as patient will have intermittent fifth digit numbness and tingling, thumb pain, dorsal carpal pain - Patient has received several week relief from bilateral carpal tunnel CSI in the past which would support diagnosis of carpal tunnel - Patient had unremarkable nerve conduction study about 2 years ago, though he says he was not particularly flared when that study was done and he has had more consistent symptoms over the past 2 years.  Patient interested in repeating nerve conduction study to obtain a more definitive diagnosis which I agree is reasonable at this time  - Start meloxicam 15 mg daily x2 weeks.  If still having pain after 2 weeks, complete 3rd-week of meloxicam. May use remaining meloxicam as needed once daily for pain control.  Do not to use additional NSAIDs while taking meloxicam.  May use Tylenol [phone] mg 2 to 3 times a day for breakthrough pain. - Use wrist splints when physically active  Pertinent previous records reviewed include left hand x-ray 01/04/2020, right hand x-ray 01/04/2020   Follow Up: 1 week after nerve conduction study to review results and discuss treatment plan which may include repeat carpal tunnel CSI versus surgical referral   Subjective:   I, Moenique Parris, am serving as a Neurosurgeon for Doctor Richardean Sale  Chief Complaint: hand pain   HPI:   07/22/2020 Scott Durham is a 21 y.o. male who presents to [facility] today for bilat hand pain and  numbness. Pt was last seen by Dr. Denyse Amass on 01/15/20 and was given bilat carpal tunnel steroid injections and advised to con't night splints. He has tried Gabapentin previously.  Pt was also advised to f/u w/ rheumatology. Today, pt reports that his B hands have been bothering him again for approximately one month.  He has been to rheumatology and was told that all the tests they performed were normal.  He reports that the B carpal tunnel injections that he had helped for a few weeks/ one month.  He con't to wear his wrist splints/braces.   Dx imaging: 01/04/20 R & L hand XR   09/12/21 Patient states right hand is bothering him , hands cause pain and tingling and numbness.  Numbness and tingling is intermittent and inconsistent, typically affecting digits 2-4, and occasionally affecting fifth digit.  Frequently has pain through first digit.  Denies any trauma, falls.  States that he had a nerve conduction study about 2 years ago, that he is not particularly flared at that time.  Relevant Historical Information:    Additional pertinent review of systems negative.   Current Outpatient Medications:    meloxicam (MOBIC) 15 MG tablet, Take 1 tablet (15 mg total) by mouth daily., Disp: 14 tablet, Rfl: 0   meloxicam (MOBIC) 15 MG tablet, Take 1 tablet (15 mg total) by mouth daily., Disp: 30 tablet, Rfl: 0   meloxicam (MOBIC) 15 MG tablet, Take 1 tablet (15 mg total) by mouth daily., Disp: 30 tablet, Rfl: 0   omeprazole (PRILOSEC) 40 MG capsule, TAKE 1 CAPSULE (40 MG TOTAL) BY MOUTH DAILY. (  Patient not taking: Reported on 03/07/2021), Disp: 30 capsule, Rfl: 1   Objective:     Vitals:   09/12/21 1018  BP: 126/84  Pulse: 77  SpO2: 99%  Weight: 215 lb (97.5 kg)  Height: 5\' 10"  (1.778 m)      Body mass index is 30.85 kg/m.    Physical Exam:    General: Appears well, nad, nontoxic and pleasant Neuro:sensation intact, strength is 5/5 with df/pf/inv/ev, muscle tone wnl Skin:no susupicious lesions  or rashes  Bilateral wrist:  No deformity or swelling appreciated. ROM  Ext 90, flexion70, radial/ulnar deviation 30 TTP mildly first MCP bilaterally, dorsal carpals nttp over the snauff box,   volar carpals, radial styloid, ulnar styloid,   tfcc Positive Tinel's Positive Phalen's Equivocal finklestein Neg tfcc bounce test Mild pain with resisted ext, flex or deviation    Electronically signed by:  [facility] 10:38 AM 09/12/21

## 2021-09-12 NOTE — Patient Instructions (Addendum)
Good to see you  Bilateral nerve conduction study ordered for carpal tunnel Meloxicam 15mg  daily for two weeks if still in pain complete a 3rd week  Use tylenol for breakthrough pain  Wear wrist brace when active Work note given to be out of work today and return Monday

## 2021-09-28 ENCOUNTER — Encounter: Payer: Self-pay | Admitting: Sports Medicine

## 2021-09-29 ENCOUNTER — Other Ambulatory Visit: Payer: Self-pay | Admitting: Sports Medicine

## 2021-10-03 ENCOUNTER — Ambulatory Visit: Payer: 59 | Admitting: Family

## 2021-10-08 ENCOUNTER — Telehealth: Payer: Self-pay | Admitting: Family Medicine

## 2021-10-08 NOTE — Telephone Encounter (Signed)
FYI, pt has OV for 07/14. Do  you want him seen sooner for this or can this be sent in w/o sooner ov?

## 2021-10-08 NOTE — Telephone Encounter (Signed)
Pt's mother stated she is wanting Doxycycline ordered for pt. She stated he had an episode similar to anaphylactic shock. She stated he was on it before but I am not seeing it in his chart. Please advise

## 2021-10-09 NOTE — Telephone Encounter (Signed)
FYI,I Called and spoke with pt mom and message was taken down wrong. Pt mom states son had an ALLERGIC reaction to Doxycycline and wanted it added to allergy list ONLY. Pt mom states pt had a terrible reaction to Doxy that resulted in the EMS having to be called on pt. Pt mom states they will go into more detail at ov on 10/17/21. Doxycyline has been added to allergy list per moms request.

## 2021-10-09 NOTE — Telephone Encounter (Signed)
Are we talking about an EpiPen?  I do not understand request for doxycycline if she is concerned about allergies/anaphylaxis-perhaps this was written down wrong?

## 2021-10-17 ENCOUNTER — Encounter: Payer: 59 | Admitting: Family Medicine

## 2021-10-27 ENCOUNTER — Encounter: Payer: 59 | Admitting: Family Medicine

## 2021-11-03 ENCOUNTER — Encounter: Payer: Self-pay | Admitting: Family Medicine

## 2021-11-03 ENCOUNTER — Ambulatory Visit (INDEPENDENT_AMBULATORY_CARE_PROVIDER_SITE_OTHER): Payer: 59 | Admitting: Family Medicine

## 2021-11-03 ENCOUNTER — Other Ambulatory Visit (HOSPITAL_COMMUNITY)
Admission: RE | Admit: 2021-11-03 | Discharge: 2021-11-03 | Disposition: A | Discharge status: Home / Self Care | Payer: 59 | Source: Ambulatory Visit | Attending: Family Medicine | Admitting: Family Medicine

## 2021-11-03 VITALS — BP 100/70 | HR 67 | Temp 98.2°F | Ht 70.0 in | Wt 215.2 lb

## 2021-11-03 DIAGNOSIS — Z114 Encounter for screening for human immunodeficiency virus [HIV]: Secondary | ICD-10-CM | POA: Diagnosis not present

## 2021-11-03 DIAGNOSIS — F8081 Childhood onset fluency disorder: Secondary | ICD-10-CM

## 2021-11-03 DIAGNOSIS — Z0000 Encounter for general adult medical examination without abnormal findings: Secondary | ICD-10-CM | POA: Insufficient documentation

## 2021-11-03 DIAGNOSIS — R0602 Shortness of breath: Secondary | ICD-10-CM

## 2021-11-03 DIAGNOSIS — Z118 Encounter for screening for other infectious and parasitic diseases: Secondary | ICD-10-CM

## 2021-11-03 DIAGNOSIS — Z113 Encounter for screening for infections with a predominantly sexual mode of transmission: Secondary | ICD-10-CM | POA: Diagnosis not present

## 2021-11-03 LAB — CBC WITH DIFFERENTIAL/PLATELET
Basophils Absolute: 0.1 10*3/uL (ref 0.0–0.1)
Basophils Relative: 0.8 % (ref 0.0–3.0)
Eosinophils Absolute: 0.2 10*3/uL (ref 0.0–0.7)
Eosinophils Relative: 2.8 % (ref 0.0–5.0)
HCT: 45.3 % (ref 39.0–52.0)
Hemoglobin: 15.7 g/dL (ref 13.0–17.0)
Lymphocytes Relative: 37 % (ref 12.0–46.0)
Lymphs Abs: 2.6 10*3/uL (ref 0.7–4.0)
MCHC: 34.7 g/dL (ref 30.0–36.0)
MCV: 85.2 fl (ref 78.0–100.0)
Monocytes Absolute: 0.6 10*3/uL (ref 0.1–1.0)
Monocytes Relative: 8.3 % (ref 3.0–12.0)
Neutro Abs: 3.6 10*3/uL (ref 1.4–7.7)
Neutrophils Relative %: 51.1 % (ref 43.0–77.0)
Platelets: 261 10*3/uL (ref 150.0–400.0)
RBC: 5.31 Mil/uL (ref 4.22–5.81)
RDW: 13.1 % (ref 11.5–14.6)
WBC: 7.1 10*3/uL (ref 4.5–10.5)

## 2021-11-03 LAB — COMPREHENSIVE METABOLIC PANEL
ALT: 77 U/L — ABNORMAL HIGH (ref 0–53)
AST: 56 U/L — ABNORMAL HIGH (ref 0–37)
Albumin: 4.6 g/dL (ref 3.5–5.2)
Alkaline Phosphatase: 74 U/L (ref 39–117)
BUN: 10 mg/dL (ref 6–23)
CO2: 25 mEq/L (ref 19–32)
Calcium: 9.1 mg/dL (ref 8.4–10.5)
Chloride: 106 mEq/L (ref 96–112)
Creatinine, Ser: 0.89 mg/dL (ref 0.40–1.50)
GFR: 123.28 mL/min (ref >60.00)
Glucose, Bld: 91 mg/dL (ref 70–99)
Potassium: 4.1 mEq/L (ref 3.5–5.1)
Sodium: 139 mEq/L (ref 135–145)
Total Bilirubin: 0.6 mg/dL (ref 0.2–1.2)
Total Protein: 7.2 g/dL (ref 6.0–8.3)

## 2021-11-03 NOTE — Patient Instructions (Addendum)
We will call you within two weeks about your referral to speech therapy for stuttering. If you do not hear within 2 weeks, give [place] a call.   Please stop by lab before you go If you have mychart- we will send your results within 3 business days of [place] receiving them.  If you do not have mychart- we will call you about results within 5 business days of [place] receiving them.  *please also note that you will see labs on mychart as soon as they post. I will later go in and write notes on them- will say "notes from Dr. Durene Cal"  Recommended follow up: Return in about 1 year (around 11/04/2022) for physical or sooner if needed.Schedule b4 you leave.

## 2021-11-03 NOTE — Progress Notes (Addendum)
Phone: [phone]    Subjective:  Patient presents today for their annual physical. Chief complaint-noted.   See problem oriented charting- ROS- full  review of systems was completed and negative  except for: shortness of breath and rash, some stuttering  The following were reviewed and entered/updated in epic: Past Medical History:  Diagnosis Date   Migraine    saw neurology in teenage years- now off all medicines. had been on amitriptyline, sumatriptan, and nauseas medicine    Patient Active Problem List   Diagnosis Date Noted   Allergic rhinitis 07/03/2015    Priority: Low   Acne vulgaris 07/03/2015    Priority: Low   Migraine    Past Surgical History:  Procedure Laterality Date   APPENDECTOMY     CHOLECYSTECTOMY     TONSILLECTOMY AND ADENOIDECTOMY      Family History  Problem Relation Age of Onset   Hiatal hernia Mother    GER disease Mother    Other Father        unknown history   Hyperlipidemia Maternal Grandmother    Hyperlipidemia Maternal Grandfather    Aortic aneurysm Paternal Grandmother        had surgery   COPD Paternal Grandmother    Other Paternal Grandfather        back pain issue,    Cancer - Other Paternal Grandfather     Medications- reviewed and updated No current outpatient medications on file.   No current facility-administered medications for this visit.    Allergies-reviewed and updated Allergies  Allergen Reactions   Doxycycline Anaphylaxis, Swelling and Rash   Penicillins Rash   Benzoyl Peroxide     hives   Adhesive [Tape] Rash    Social History   Social History Narrative   Single- lives with mom.       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 Still considering long term work on stock/financing.    Graduated at [employer]: time with friends      Objective:  BP 100/70   Pulse 67   Temp 98.2 F (36.8 C)   Ht 5' 10" (1.778 m)   Wt 215 lb 3.2 oz (97.6 kg)    SpO2 99%   BMI 30.88 kg/m  Gen: NAD, resting comfortably HEENT: Mucous membranes are moist. Oropharynx normal Neck: no thyromegaly CV: RRR no murmurs rubs or gallops Lungs: CTAB no crackles, wheeze, rhonchi Abdomen: soft/nontender/nondistended/normal bowel sounds. No rebound or guarding.  Ext: no edema Skin: warm, dry Neuro: grossly normal, moves all extremities, PERRLA Normal testicular exam. No skin abnormalities in region- showed how to do self exam    Assessment and Plan:  21 y.o. male presenting for annual physical.  Health Maintenance counseling: 1. Anticipatory guidance: Patient counseled regarding regular dental exams -q6 months, eye exams -referred last year- did not go- he wants to hold off- states vision stable,  avoiding smoking and second hand smoke, avoiding alcohol at his age <19,  no illicit drugs.   2. Risk factor reduction:  Advised patient of need for regular exercise and diet rich and fruits and vegetables to reduce risk of heart attack and stroke.  Exercise- 3 days a week recently.  Diet/weight management-weight stable last year within 1 lb- encouraged mild gradual weight loss Wt Readings from Last 3 Encounters:  11/03/21 215 lb 3.2 oz (97.6 kg)  09/12/21 215 lb (97.5 kg)  06/26/21 212 lb (96.2 kg)  3. Immunizations/screenings/ancillary studies- consider fall flu shot  Immunization History  Administered Date(s) Administered   DTaP 05/30/2001, 07/28/2001, 10/13/2001, 02/09/2003, 06/23/2005   HPV 9-valent 07/09/2014   HPV Quadrivalent 07/09/2014, 04/19/2015   Hepatitis B [birth year]10[birth year]00, 04/29/2001, 10/13/2001   Hepatitis B, ped/adol 09-26-00, 04/29/2001, 10/13/2001   HiB (PRP-OMP) 05/30/2001, 07/28/2001, 10/13/2001, 06/18/2002   HiB (PRP-T) 05/30/2001, 07/28/2001, 10/13/2001, 06/18/2002   IPV 05/30/2001, 07/28/2001, 08/09/2002, 02/09/2003, 06/23/2005   Influenza,inj,Quad PF,6+ Mos 12/28/2015, 01/02/2020   Influenza-Unspecified 12/19/2010, 12/21/2011,  03/22/2013, 03/22/2013, 04/19/2015, 12/28/2015, 12/31/2016   MMR 06/23/2002, 06/23/2005   Meningococcal B, OMV 05/02/2019, 07/10/2019   Meningococcal Conjugate 03/22/2013, 05/02/2019   Moderna Sars-Covid-2 Vaccination 03/04/2020, 04/04/2020   Pneumococcal-Unspecified 05/30/2001, 07/28/2001, 10/13/2001, 02/09/2003   Tdap 08/12/2011, 08/12/2011, 07/04/2018, 02/13/2019, 10/01/2021   Varicella 06/23/2002, 06/23/2005  4. Prostate cancer screening-  no family history, start at age 53  5. Colon cancer screening -  grandfather in mid 59s had colon cancer- will start him at age 1 likely- no first degree relative with earlier history 6. Skin cancer screening/prevention- no dermatologist. advised regular sunscreen use. Denies worrisome, changing, or new skin lesions.  7. Testicular cancer screening- advised monthly self exams - will start after today- showe dhim how to do exam 8. STD screening- patient opts  in  again today- encouraged to always use protection 9. Smoking associated screening- Never smoker  Status of chronic or acute concerns   #migraine without aura- phenergan helps with nausea in past- hasnt needed. Has not recently needed frova  #screen lipids- ok 2021- repeat 2026  # SOB S:ongoing since 2022 on and off. Had considered GERD but omeprazole 40 mg not helpful- also wondered about gastric ulcer but with lack of improvement after 2 months of therapy stopped. CXR in urgent care as well as EKG last year. PERC negative at that time- no change in symptoms.  - SOB described more as difficulty with good deep breath. No SOB with activity. No longer getting chest discomfort he had last year. Overall improvign.  -prior right sided pain improved with better bowel movements  A/P: intermittent and not as bad as it was- he prefers to monitor only- especially since no exertional pain- more trouble at times with deep breaths.    #  carpal tunnel- intermittent issues- not using braces- didn't get  called about nerve conduction study -has nto heard about nerve conduction study- he will call back to check on this  # Recent ED visit- recent rx azithromycin and doxycycline for dental procedure  prior to 10/06/21 visit- had severe reaction to doxycyline- with upper lip and left face swelling added to allergy list previously- 2 hour observation and given benadryl  and prednisone- changed to clindamycin- dental issue now better  # Stuttering S:started with dental issues abotu 1.5 years ago- has tried to smile less and talk lower- has noted more stuttering in this time frame  A/P: we discussed possible referral to speech therapy- he is interested- referral was placed today   #heat rash- intermittent- hydrocortisone helps sporadically  Recommended follow up: Return in about 1 year (around 11/04/2022) for physical or sooner if needed.Schedule b4 you leave.  Lab/Order associations: fasting   ICD-10-CM   1. Preventative health care  Z00.00 HIV Antibody (routine testing w rflx)    RPR    Urine cytology ancillary only    CBC with Differential/Platelet    Comprehensive metabolic panel    2. Screening for gonorrhea  Z11.3 Urine cytology ancillary only    3.  Screening for chlamydial disease  Z11.8 Urine cytology ancillary only    4. Screening for HIV (human immunodeficiency virus)  Z11.4 HIV Antibody (routine testing w rflx)    5. Screening examination for venereal disease  Z11.3 RPR    6. Shortness of breath  R06.02 CBC with Differential/Platelet    Comprehensive metabolic panel    7. Stuttering  F80.81 Ambulatory referral to Speech Therapy      No orders of the defined types were placed in this encounter.   Return precautions advised.   Garret Reddish, MD

## 2021-11-04 LAB — HIV ANTIBODY (ROUTINE TESTING W REFLEX): HIV 1&2 Ab, 4th Generation: NONREACTIVE

## 2021-11-04 LAB — RPR: RPR Ser Ql: NONREACTIVE

## 2021-11-05 ENCOUNTER — Other Ambulatory Visit: Payer: Self-pay

## 2021-11-05 DIAGNOSIS — R7989 Other specified abnormal findings of blood chemistry: Secondary | ICD-10-CM

## 2021-11-05 LAB — URINE CYTOLOGY ANCILLARY ONLY
Chlamydia: NEGATIVE
Comment: NEGATIVE
Comment: NEGATIVE
Comment: NORMAL
Neisseria Gonorrhea: NEGATIVE
Trichomonas: NEGATIVE

## 2021-12-03 ENCOUNTER — Encounter: Payer: Self-pay | Admitting: Family Medicine

## 2021-12-15 ENCOUNTER — Other Ambulatory Visit (HOSPITAL_COMMUNITY): Payer: Self-pay

## 2021-12-29 ENCOUNTER — Encounter: Payer: Self-pay | Admitting: *Deleted

## 2022-01-05 ENCOUNTER — Other Ambulatory Visit (INDEPENDENT_AMBULATORY_CARE_PROVIDER_SITE_OTHER): Payer: 59

## 2022-01-05 DIAGNOSIS — R7989 Other specified abnormal findings of blood chemistry: Secondary | ICD-10-CM | POA: Diagnosis not present

## 2022-01-05 LAB — COMPREHENSIVE METABOLIC PANEL
ALT: 33 U/L (ref 0–53)
AST: 19 U/L (ref 0–37)
Albumin: 4.6 g/dL (ref 3.5–5.2)
Alkaline Phosphatase: 72 U/L (ref 39–117)
BUN: 16 mg/dL (ref 6–23)
CO2: 25 mEq/L (ref 19–32)
Calcium: 9.6 mg/dL (ref 8.4–10.5)
Chloride: 105 mEq/L (ref 96–112)
Creatinine, Ser: 0.91 mg/dL (ref 0.40–1.50)
GFR: 121.1 mL/min (ref >60.00)
Glucose, Bld: 101 mg/dL — ABNORMAL HIGH (ref 70–99)
Potassium: 4.3 mEq/L (ref 3.5–5.1)
Sodium: 138 mEq/L (ref 135–145)
Total Bilirubin: 0.4 mg/dL (ref 0.2–1.2)
Total Protein: 7.3 g/dL (ref 6.0–8.3)

## 2022-01-08 ENCOUNTER — Ambulatory Visit: Payer: 59 | Admitting: Family Medicine

## 2022-01-14 ENCOUNTER — Ambulatory Visit (INDEPENDENT_AMBULATORY_CARE_PROVIDER_SITE_OTHER): Payer: 59 | Admitting: Family Medicine

## 2022-01-14 ENCOUNTER — Encounter: Payer: Self-pay | Admitting: Family Medicine

## 2022-01-14 VITALS — BP 110/66 | HR 88 | Temp 98.6°F | Ht 70.0 in | Wt 212.4 lb

## 2022-01-14 DIAGNOSIS — Z23 Encounter for immunization: Secondary | ICD-10-CM | POA: Diagnosis not present

## 2022-01-14 DIAGNOSIS — F5101 Primary insomnia: Secondary | ICD-10-CM

## 2022-01-14 DIAGNOSIS — G4700 Insomnia, unspecified: Secondary | ICD-10-CM | POA: Insufficient documentation

## 2022-01-14 MED ORDER — HYDROXYZINE HCL 25 MG PO TABS: 25.0000 mg | ORAL_TABLET | Freq: Every evening | ORAL | 2 refills | Status: DC | PRN

## 2022-01-14 NOTE — Patient Instructions (Addendum)
Scheduled visit with Dr. Glennon Mac for left foot pain with [facility]  Please call [phone] to schedule a visit with [facility] - please tell the office you were directly referred by Dr. Yong Channel to see Clint Bolder  Can try hydroxyzine at bedtime if no good sleep in a couple nights. If wake up and have not taken this when you went to bed can try half tablet just be careful in the morning and make sure feel full awake -consider ambien extended release if this does not work  AVOID naps so you can be tired at night.    Recommended follow up: Return for next already scheduled visit or sooner if needed.

## 2022-01-14 NOTE — Progress Notes (Signed)
Phone [phone] In person visit   Subjective:   Scott Durham is a 21 y.o. year old very pleasant male patient who presents for/with See problem oriented charting Chief Complaint  Patient presents with   Insomnia    Pt c/o of insomnia going on for couple of months, tried melatonin did not seem to help   Past Medical History-  Patient Active Problem List   Diagnosis Date Noted   Insomnia 01/14/2022    Priority: Medium    Allergic rhinitis 07/03/2015    Priority: Low   Acne vulgaris 07/03/2015    Priority: Low   Migraine     Medications- reviewed and updated Current Outpatient Medications  Medication Sig Dispense Refill   hydrOXYzine (ATARAX) 25 MG tablet Take 1 tablet (25 mg total) by mouth at bedtime as needed (insomnia/anxiety.). Take one 25 mg tablet 30-60 minutes prior to bedtime for insomnia. 30 tablet 2   No current facility-administered medications for this visit.     Objective:  BP 110/66   Pulse 88   Temp 98.6 F (37 C)   Ht 5\' 10"  (1.778 m)   Wt 212 lb 6.4 oz (96.3 kg)   SpO2 98%   BMI 30.48 kg/m  Gen: NAD, resting comfortably    Assessment and Plan   # Insomnia S: Medication:none- failed melatonin   REports issues on and off for 1-2 years. Has never been the best sleeper. Sometimes wakes up tired or with a headache in the AM.   Last 2-3 months waking up between 1-2 am. Goes to bed 10 am. Wakes up 2-3 hours and hard to fall back asleep. Tried melatonin- was taking 2 of unknown dose melatonin - not getting bad dreams with this but not working (discussed 1-3 mg likely more effective per studies)- first week actually slept worse on the medicine.  -tv in bedroom- turns off 30 minutes before bed - doesn't use phone after turns tv off -not using tablet -keeps room dark- has not tried sleep masks - when wakes up- will either try to get back to sleep or get cup of water -some anxiety when waking up -naps a lot -caffeine only in the am -denies anhedonia  or depressed mood A/P: Insomnia poor control -add CBT-I-referred -failed trazodone -avoid napping -medication option "Can try hydroxyzine at bedtime if no good sleep in a couple nights. If wake up and have not taken this when you went to bed can try half tablet just be careful in the morning and make sure feel full awake -consider ambien extended release if this does not work"  #left foot pain- started back in march at time of high ankle sprain- Dr. April mentioned 2 week follow up- since he is still having some pain with middle toe movement causing movement throughout the foot- strongly encouraged return visit  Recommended follow up: Return for next already scheduled visit or sooner if needed. Future Appointments  Date Time Provider Department Center  11/06/2022  8:00 AM 01/06/2023, MD [facility]    Lab/Order associations:   ICD-10-CM   1. Primary insomnia  F51.01 Ambulatory referral to Psychology    2. Need for immunization against influenza  Z23 Flu Vaccine QUAD 13mo+IM (Fluarix, Fluzone & Alfiuria Quad PF)      Meds ordered this encounter  Medications   hydrOXYzine (ATARAX) 25 MG tablet    Sig: Take 1 tablet (25 mg total) by mouth at bedtime as needed (insomnia/anxiety.). Take one 25 mg tablet 30-60 minutes  prior to bedtime for insomnia.    Dispense:  30 tablet    Refill:  2   Time Spent: 21 minutes of total time (11:03 AM- 11:24 AM) was spent on the date of the encounter performing the following actions: chart review prior to seeing the patient, obtaining history, performing a medically necessary exam, counseling on the treatment plan as well as other potential options, placing orders, and documenting in our EHR.   Return precautions advised.  Garret Reddish, MD

## 2022-02-12 ENCOUNTER — Ambulatory Visit (INDEPENDENT_AMBULATORY_CARE_PROVIDER_SITE_OTHER): Payer: 59 | Admitting: Physician Assistant

## 2022-02-12 ENCOUNTER — Encounter: Payer: Self-pay | Admitting: Physician Assistant

## 2022-02-12 VITALS — BP 116/76 | HR 99 | Temp 98.4°F | Ht 70.0 in | Wt 212.0 lb

## 2022-02-12 DIAGNOSIS — R051 Acute cough: Secondary | ICD-10-CM

## 2022-02-12 LAB — POCT RAPID STREP A (OFFICE): Rapid Strep A Screen: NEGATIVE

## 2022-02-12 LAB — POC COVID19 BINAXNOW: SARS Coronavirus 2 Ag: NEGATIVE

## 2022-02-12 MED ORDER — AZITHROMYCIN 250 MG PO TABS: ORAL_TABLET | ORAL | 0 refills | Status: AC

## 2022-02-12 NOTE — Progress Notes (Signed)
Scott Durham is a 21 y.o. male here for a new problem.  History of Present Illness:   Chief Complaint  Patient presents with   Nasal Congestion    Started last saturday   Sore Throat    Cold Patient complains that he is feeling congested since 5 days ago. He states that he had a sore throat initially that went away but came back yesterday. Patient is also complaining of losing his voice, a productive cough with mucus, and rhinorrhea. He reports that he has been around people at work that are sick, with no one confirmed Covid. He manages his symptoms with honey.   Denies: chest pain, SOB  Past Medical History:  Diagnosis Date   Migraine    saw neurology in teenage years- now off all medicines. had been on amitriptyline, sumatriptan, and nauseas medicine      Social History   Tobacco Use   Smoking status: Never   Smokeless tobacco: Never  Vaping Use   Vaping Use: Never used  Substance Use Topics   Alcohol use: No   Drug use: Yes    Types: Marijuana    Past Surgical History:  Procedure Laterality Date   APPENDECTOMY     CHOLECYSTECTOMY     TONSILLECTOMY AND ADENOIDECTOMY      Family History  Problem Relation Age of Onset   Hiatal hernia Mother    GER disease Mother    Other Father        unknown history   Hyperlipidemia Maternal Grandmother    Hyperlipidemia Maternal Grandfather    Aortic aneurysm Paternal Grandmother        had surgery   COPD Paternal Grandmother    Other Paternal Grandfather        back pain issue,    Cancer - Other Paternal Grandfather     Allergies  Allergen Reactions   Doxycycline Anaphylaxis, Swelling and Rash   Penicillins Rash   Benzoyl Peroxide     hives   Adhesive [Tape] Rash    Current Medications:   Current Outpatient Medications:    azithromycin (ZITHROMAX) 250 MG tablet, Take 2 tablets on day 1, then 1 tablet daily on days 2 through 5, Disp: 6 tablet, Rfl: 0   hydrOXYzine (ATARAX) 25 MG tablet, Take 1 tablet (25 mg  total) by mouth at bedtime as needed (insomnia/anxiety.). Take one 25 mg tablet 30-60 minutes prior to bedtime for insomnia., Disp: 30 tablet, Rfl: 2   Review of Systems:   Review of Systems  HENT:  Positive for congestion and sore throat.        (+) rhinorrhea   Respiratory:  Positive for cough.     Vitals:   Vitals:   02/12/22 0813  BP: 116/76  Pulse: 99  Temp: 98.4 F (36.9 C)  TempSrc: Temporal  SpO2: 97%  Weight: 212 lb (96.2 kg)  Height: 5\' 10"  (1.778 m)     Body mass index is 30.42 kg/m.  Physical Exam:   Physical Exam Vitals and nursing note reviewed.  Constitutional:      General: He is not in acute distress.    Appearance: Normal appearance. He is well-developed. He is not ill-appearing or toxic-appearing.  HENT:     Head: Normocephalic and atraumatic.     Right Ear: Ear canal and external ear normal. A middle ear effusion is present. Tympanic membrane is not erythematous, retracted or bulging.     Left Ear: Ear canal and external ear normal. A middle  ear effusion is present. Tympanic membrane is not erythematous, retracted or bulging.     Nose: Nose normal.     Right Sinus: No maxillary sinus tenderness or frontal sinus tenderness.     Left Sinus: No maxillary sinus tenderness or frontal sinus tenderness.     Mouth/Throat:     Pharynx: Uvula midline. No posterior oropharyngeal erythema.  Eyes:     General: Lids are normal.     Extraocular Movements: Extraocular movements intact.     Conjunctiva/sclera: Conjunctivae normal.     Pupils: Pupils are equal, round, and reactive to light.  Neck:     Trachea: Trachea normal.  Cardiovascular:     Rate and Rhythm: Normal rate and regular rhythm.     Heart sounds: Normal heart sounds, S1 normal and S2 normal. No murmur heard.    No gallop.  Pulmonary:     Effort: Pulmonary effort is normal. No respiratory distress.     Breath sounds: Normal breath sounds. No decreased breath sounds, wheezing, rhonchi or rales.   Lymphadenopathy:     Cervical: No cervical adenopathy.  Skin:    General: Skin is warm and dry.  Neurological:     Mental Status: He is alert and oriented to person, place, and time.  Psychiatric:        Speech: Speech normal.        Behavior: Behavior normal. Behavior is cooperative.        Judgment: Judgment normal.    Results for orders placed or performed in visit on 02/12/22  POC COVID-19  Result Value Ref Range   SARS Coronavirus 2 Ag Negative Negative  POCT rapid strep A  Result Value Ref Range   Rapid Strep A Screen Negative Negative    Assessment and Plan:   Acute cough No red flags on discussion, patient is not in any obvious distress during our visit. Discussed progression of most viral illness, and recommended supportive care at this point in time. I did however provide pocket rx for oral azithromycin should symptoms not improve as anticipated. Discussed over the counter supportive care options, with recommendations to push fluids and rest. Reviewed return precautions including new/worsening fever, SOB, new/worsening cough or other concerns.  Recommended need to self-quarantine and practice social distancing until symptoms resolve. Discussed current recommendations for COVID testing. I recommend that patient follow-up if symptoms worsen or persist despite treatment x 7-10 days, sooner if needed.   I,Verona Buck,acting as a Neurosurgeon for [employer], PA.,have documented all relevant documentation on the behalf of Jarold Motto, PA,as directed by  Jarold Motto, PA while in the presence of Jarold Motto, [place].  I, Jarold Motto, [place], have reviewed all documentation for this visit. The documentation on 02/12/22 for the exam, diagnosis, procedures, and orders are all accurate and complete.   Jarold Motto, PA-C

## 2022-03-24 ENCOUNTER — Ambulatory Visit (INDEPENDENT_AMBULATORY_CARE_PROVIDER_SITE_OTHER): Payer: 59 | Admitting: Family Medicine

## 2022-03-24 ENCOUNTER — Encounter: Payer: Self-pay | Admitting: Family Medicine

## 2022-03-24 VITALS — BP 130/70 | HR 64 | Temp 98.1°F | Ht 70.0 in | Wt 217.0 lb

## 2022-03-24 DIAGNOSIS — R112 Nausea with vomiting, unspecified: Secondary | ICD-10-CM | POA: Diagnosis not present

## 2022-03-24 DIAGNOSIS — R0981 Nasal congestion: Secondary | ICD-10-CM

## 2022-03-24 DIAGNOSIS — R197 Diarrhea, unspecified: Secondary | ICD-10-CM | POA: Diagnosis not present

## 2022-03-24 LAB — POC COVID19 BINAXNOW: SARS Coronavirus 2 Ag: NEGATIVE

## 2022-03-24 MED ORDER — ONDANSETRON HCL 4 MG PO TABS: 4.0000 mg | ORAL_TABLET | Freq: Three times a day (TID) | ORAL | 0 refills | Status: DC | PRN

## 2022-03-24 NOTE — Patient Instructions (Addendum)
Please stop by lab before you go If you have mychart- we will send your results within 3 business days of [place] receiving them.  If you do not have mychart- we will call you about results within 5 business days of [place] receiving them.  *please also note that you will see labs on mychart as soon as they post. I will later go in and write notes on them- will say "notes from Dr. Durene Cal"   Try zofran for nausea and vomiting- suspect this is a bad stomach bug- we have seen several lately.   I want you to update me if new or worsening symptoms- this should get better within 7 days- but honestly with abdominal pain if not doing better in another 2 days let me know- would consider CT scan (you wanted to hold for now especially since already have had appendix and gallbladder removed- but scar tissue could be causing some of pain issues)  Recommended follow up: Return for as needed for new, worsening, persistent symptoms.

## 2022-03-24 NOTE — Progress Notes (Signed)
Phone [phone] In person visit   Subjective:   Scott Durham is a 21 y.o. year old very pleasant male patient who presents for/with See problem oriented charting Chief Complaint  Patient presents with   Diarrhea    Pt c/o diarrhea and vomiting x3 days with bad middle abdominal pain and weakness, he was able to eat rice yesterday and keep that down and water.   Past Medical History-  Patient Active Problem List   Diagnosis Date Noted   Insomnia 01/14/2022    Priority: Medium    Allergic rhinitis 07/03/2015    Priority: Low   Acne vulgaris 07/03/2015    Priority: Low   Migraine     Medications- reviewed and updated Current Outpatient Medications  Medication Sig Dispense Refill   hydrOXYzine (ATARAX) 25 MG tablet Take 1 tablet (25 mg total) by mouth at bedtime as needed (insomnia/anxiety.). Take one 25 mg tablet 30-60 minutes prior to bedtime for insomnia. 30 tablet 2   No current facility-administered medications for this visit.     Objective:  BP 130/70   Pulse 64   Temp 98.1 F (36.7 C)   Ht 5\' 10"  (1.778 m)   Wt 217 lb (98.4 kg)   SpO2 98%   BMI 31.14 kg/m  Gen: NAD, resting comfortably CV: RRR no murmurs rubs or gallops Lungs: CTAB no crackles, wheeze, rhonchi Abdomen: soft/moderate periumbilical pain-palpating in other areas causes some periumbilical pain/nondistended/normal bowel sounds. No rebound or guarding.  Ext: no edema Skin: warm, dry   Results for orders placed or performed in visit on 03/24/22 (from the past 24 hour(s))  POC COVID-19     Status: None   Collection Time: 03/24/22 11:56 AM  Result Value Ref Range   SARS Coronavirus 2 Ag Negative Negative       Assessment and Plan   # Diarrhea/vomiting/abdominal pain.  S: Started: 3 days ago with diarrhea/vomiting. Had some abdominal even prior to this for about a week-Complains of moderate to severe periumbilical pain but also hurts through the whole abdomen. Feels generalized weakness and  feels lightheaded. Sensation of emesis almost going "up to his face" The symptoms are gradually improving today but had surge in diarrhea/vomiting yesterday.  Stools per day: diarrhea x 1 in last 24 hours. Slight blood in toilet yesterday but none other than that.  Emesis-  nonbilious, nonbloody - Was able to eat some rice yesterday and keep that down along with water Relation to food or medication: food worsens nausea and worsens abdominal pain Therapy tried so far: had an old phenergan which didn't help much. Took tylenol and didn't help - has never had this persistent of abdominal pain outside of time when he needed surgery- history appendectomy and cholecystectomy - several sick contacts at work including covid  ROS- No unusual headaches, slight dizziness.  No black or bloody stools as above other than light volume yesterday morning.   No mucus in stools. No fever or shortness of breath  A/P: Patient likely with viral gastroenteritis-3 days of diarrhea/vomiting/epigastric abdominal pain with multiple sick contacts with similar at work.  Multiple coworkers also with COVID-we tested for COVID today and negative (he had some mild cough) -Dry mucous membranes on exam and does appear dehydrated-opted to update labs today - dissolvable zofran hasn't worked in past but reports regular Zofran has been more effective than Phenergan and refilled this today -Patient with history of gallbladder and appendix removal-could have some abdominal adhesions/scar tissue contributing to pain but still  having bowel movements so doubt obstruction - Discussed potential CT but since he feels he is improving some outside of abdominal pain he wants to see if this will improve further before considering CT abdomen pelvis for periumbilical pain-eval for consequences of prior surgery such as adhesions or potential mass or intra-abdominal abscess (suspect unlikely)  Recommended follow up: Return for as needed for new,  worsening, persistent symptoms. Future Appointments  Date Time Provider Department Center  11/06/2022  8:00 AM Shelva Majestic, MD [facility]    Lab/Order associations:   ICD-10-CM   1. Diarrhea, unspecified type  R19.7 CBC with Differential/Platelet    Comprehensive metabolic panel    2. Nausea and vomiting, unspecified vomiting type  R11.2 CBC with Differential/Platelet    Comprehensive metabolic panel    3. Nasal congestion  R09.81 POC COVID-19      Meds ordered this encounter  Medications   ondansetron (ZOFRAN) 4 MG tablet    Sig: Take 1 tablet (4 mg total) by mouth every 8 (eight) hours as needed for nausea or vomiting.    Dispense:  20 tablet    Refill:  0    Time Spent: 23 minutes of total time (11:40 AM-12:03 PM) was spent on the date of the encounter performing the following actions: Brief chart review prior to seeing the patient, obtaining history, performing a medically necessary exam, counseling on the treatment plan and workup options as well as emergent precautions, placing orders, and documenting in our EHR.    Return precautions advised.  Tana Conch, MD

## 2022-03-25 ENCOUNTER — Encounter: Payer: Self-pay | Admitting: Family Medicine

## 2022-03-25 NOTE — Telephone Encounter (Signed)
I saw a note you'd already written for today.

## 2022-04-13 NOTE — Progress Notes (Unsigned)
   I, Peterson Lombard, LAT, ATC acting as a scribe for Lynne Leader, MD.  Scott Durham is a 22 y.o. male who presents to [place] at [facility] today for cont'd bilat hand pain, R>L. Pt was last seen for this issue by Dr. Glennon Mac on 09/12/21 and was prescribed meloxicam. Pt was last seen by Dr. Georgina Snell on 07/22/20 and was referred to hand therapy. Today, pt reports  Dx testing: 07/22/20 Labs  03/12/20 NCV study 01/04/20 R & L hand XR  Pertinent review of systems: ***  Relevant historical information: ***   Exam:  There were no vitals taken for this visit. General: Well Developed, well nourished, and in no acute distress.   MSK: ***    Lab and Radiology Results No results found for this or any previous visit (from the past 72 hour(s)). No results found.     Assessment and Plan: 22 y.o. male with ***   PDMP not reviewed this encounter. No orders of the defined types were placed in this encounter.  No orders of the defined types were placed in this encounter.    Discussed warning signs or symptoms. Please see discharge instructions. Patient expresses understanding.   ***

## 2022-04-14 ENCOUNTER — Ambulatory Visit (INDEPENDENT_AMBULATORY_CARE_PROVIDER_SITE_OTHER): Payer: 59

## 2022-04-14 ENCOUNTER — Ambulatory Visit: Payer: Self-pay

## 2022-04-14 ENCOUNTER — Ambulatory Visit (INDEPENDENT_AMBULATORY_CARE_PROVIDER_SITE_OTHER): Payer: 59 | Admitting: Family Medicine

## 2022-04-14 VITALS — BP 132/88 | HR 73 | Ht 70.0 in | Wt 221.0 lb

## 2022-04-14 DIAGNOSIS — M79642 Pain in left hand: Secondary | ICD-10-CM

## 2022-04-14 DIAGNOSIS — M79641 Pain in right hand: Secondary | ICD-10-CM

## 2022-04-14 LAB — CK: Total CK: 134 U/L (ref 7–232)

## 2022-04-14 LAB — URIC ACID: Uric Acid, Serum: 6.6 mg/dL (ref 4.0–7.8)

## 2022-04-14 LAB — SEDIMENTATION RATE: Sed Rate: 3 mm/hr (ref 0–15)

## 2022-04-14 MED ORDER — MELOXICAM 15 MG PO TABS: 15.0000 mg | ORAL_TABLET | Freq: Every day | ORAL | 0 refills | Status: DC

## 2022-04-14 NOTE — Patient Instructions (Addendum)
Thank you for coming in today.   You received an injection today. Seek immediate medical attention if the joint becomes red, extremely painful, or is oozing fluid.   Please get an Xray today before you leave   Please get labs today before you leave   Try meloxicam.   I've referred you to Occupational  Therapy.  Let us know if you don't hear from them in one week.   Check back in 6 weeks

## 2022-04-15 NOTE — Progress Notes (Signed)
Early labs have come back and are normal so far.

## 2022-04-15 NOTE — Progress Notes (Signed)
Right hand x-ray looks normal to radiology.  No significant arthritis is seen.

## 2022-04-16 LAB — RHEUMATOID FACTOR: Rheumatoid fact SerPl-aCnc: <14 IU/mL (ref <14)

## 2022-04-16 LAB — COMPLEMENT, TOTAL: Compl, Total (CH50): >60 U/mL — ABNORMAL HIGH (ref 31–60)

## 2022-04-16 LAB — CYCLIC CITRUL PEPTIDE ANTIBODY, IGG: Cyclic Citrullin Peptide Ab: <16 UNITS

## 2022-04-16 LAB — ANA: Anti Nuclear Antibody (ANA): NEGATIVE

## 2022-04-16 LAB — HLA-B27 ANTIGEN: HLA-B27 Antigen: NEGATIVE

## 2022-04-17 ENCOUNTER — Telehealth: Payer: Self-pay | Admitting: Family Medicine

## 2022-04-17 ENCOUNTER — Other Ambulatory Visit: Payer: Self-pay | Admitting: *Deleted

## 2022-04-17 MED ORDER — HYDROXYZINE HCL 25 MG PO TABS: 25.0000 mg | ORAL_TABLET | Freq: Every evening | ORAL | 2 refills | Status: DC | PRN

## 2022-04-17 NOTE — Telephone Encounter (Signed)
  Encourage patient to contact the pharmacy for refills or they can request refills through [facility]:  Please schedule appointment if longer than 1 year  NEXT APPOINTMENT DATE:  MEDICATION:  hydrOXYzine (ATARAX) 25 MG tablet   Is the patient out of medication? Yes  PHARMACY:  [facility] [address], [place] - [address] AT [address] Phone: [phone]  Fax: [phone]      Let patient know to contact pharmacy at the end of the day to make sure medication is ready.  Please notify patient to allow 48-72 hours to process

## 2022-04-19 ENCOUNTER — Ambulatory Visit
Admission: EM | Admit: 2022-04-19 | Discharge: 2022-04-19 | Disposition: A | Discharge status: Home / Self Care | Payer: 59 | Attending: Physician Assistant | Admitting: Physician Assistant

## 2022-04-19 DIAGNOSIS — J069 Acute upper respiratory infection, unspecified: Secondary | ICD-10-CM | POA: Insufficient documentation

## 2022-04-19 DIAGNOSIS — Z1152 Encounter for screening for COVID-19: Secondary | ICD-10-CM | POA: Diagnosis not present

## 2022-04-19 MED ORDER — OSELTAMIVIR PHOSPHATE 75 MG PO CAPS: 75.0000 mg | ORAL_CAPSULE | Freq: Two times a day (BID) | ORAL | 0 refills | Status: DC

## 2022-04-19 NOTE — ED Provider Notes (Signed)
[facility]    CSN: [id number] Arrival date & time: 04/19/22  4742      History   Chief Complaint Chief Complaint  Patient presents with   Cough   Nasal Congestion   Sore Throat    HPI Scott Durham is a 22 y.o. male.   Patient here today for evaluation of sore throat, congestion and productive cough that started 2 days ago.  His girlfriend is here today with him as well.  He has had fever but is unsure how high.  He denies any vomiting but has had some diarrhea.  He has tried over-the-counter cough and medication without significant improvement.  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  The history is provided by the patient.    Past Medical History:  Diagnosis Date   Migraine    saw neurology in teenage years- now off all medicines. had been on amitriptyline, sumatriptan, and nauseas medicine     Patient Active Problem List   Diagnosis Date Noted   Insomnia 01/14/2022   Migraine    Allergic rhinitis 07/03/2015   Acne vulgaris 07/03/2015    Past Surgical History:  Procedure Laterality Date   APPENDECTOMY     CHOLECYSTECTOMY     TONSILLECTOMY AND ADENOIDECTOMY         Home Medications    Prior to Admission medications   Medication Sig Start Date End Date Taking? Authorizing Provider  oseltamivir (TAMIFLU) 75 MG capsule Take 1 capsule (75 mg total) by mouth every 12 (twelve) hours. 04/19/22  Yes Tomi Bamberger, PA-C  hydrOXYzine (ATARAX) 25 MG tablet Take 1 tablet (25 mg total) by mouth at bedtime as needed (insomnia/anxiety.). Take one 25 mg tablet 30-60 minutes prior to bedtime for insomnia. 04/17/22   Shelva Majestic, MD  meloxicam (MOBIC) 15 MG tablet Take 1 tablet (15 mg total) by mouth daily. 04/14/22   Rodolph Bong, MD  ondansetron (ZOFRAN) 4 MG tablet Take 1 tablet (4 mg total) by mouth every 8 (eight) hours as needed for nausea or vomiting. 03/24/22   Shelva Majestic, MD  gabapentin (NEURONTIN) 300 MG capsule Take 1  capsule (300 mg total) by mouth 3 (three) times daily as needed (nerve pain). 01/09/20 05/08/20  Rodolph Bong, MD  promethazine (PHENERGAN) 25 MG tablet Take 25 mg by mouth every 6 (six) hours as needed for nausea or vomiting.  12/31/19  [provider]    Family History Family History  Problem Relation Age of Onset   Hiatal hernia Mother    GER disease Mother    Other Father        unknown history   Hyperlipidemia Maternal Grandmother    Hyperlipidemia Maternal Grandfather    Aortic aneurysm Paternal Grandmother        had surgery   COPD Paternal Grandmother    Other Paternal Grandfather        back pain issue,    Cancer - Other Paternal Grandfather     Social History Social History   Tobacco Use   Smoking status: Never   Smokeless tobacco: Never  Vaping Use   Vaping Use: Never used  Substance Use Topics   Alcohol use: No   Drug use: Yes    Types: Marijuana     Allergies   Doxycycline, Penicillins, Benzoyl peroxide, and Adhesive [tape]   Review of Systems Review of Systems  Constitutional:  Positive for chills and fever (subjective).  HENT:  Positive for congestion and sore throat. Negative for ear pain.   Eyes:  Negative for discharge and redness.  Respiratory:  Positive for cough. Negative for shortness of breath.   Gastrointestinal:  Positive for diarrhea. Negative for nausea and vomiting.     Physical Exam Triage Vital Signs ED Triage Vitals  Enc Vitals Group     BP      Pulse      Resp      Temp      Temp src      SpO2      Weight      Height      Head Circumference      Peak Flow      Pain Score      Pain Loc      Pain Edu?      Excl. in [place]?    No data found.  Updated Vital Signs BP 130/87 (BP Location: Left Arm)   Pulse 98   Temp 98.3 F (36.8 C) (Oral)   Resp 16   SpO2 97%      Physical Exam Vitals and nursing note reviewed.  Constitutional:      General: He is not in acute distress.    Appearance: Normal appearance.  He is not ill-appearing.  HENT:     Head: Normocephalic and atraumatic.     Nose: Congestion present.     Mouth/Throat:     Mouth: Mucous membranes are moist.     Pharynx: Oropharynx is clear. No oropharyngeal exudate or posterior oropharyngeal erythema.  Eyes:     Conjunctiva/sclera: Conjunctivae normal.  Cardiovascular:     Rate and Rhythm: Normal rate and regular rhythm.     Heart sounds: Normal heart sounds. No murmur heard. Pulmonary:     Effort: Pulmonary effort is normal. No respiratory distress.     Breath sounds: Normal breath sounds. No wheezing, rhonchi or rales.  Skin:    General: Skin is warm and dry.  Neurological:     Mental Status: He is alert.  Psychiatric:        Mood and Affect: Mood normal.        Thought Content: Thought content normal.      UC Treatments / Results  Labs (all labs ordered are listed, but only abnormal results are displayed) Labs Reviewed  SARS CORONAVIRUS 2 (TAT 6-24 HRS)    EKG   Radiology No results found.  Procedures Procedures (including critical care time)  Medications Ordered in UC Medications - No data to display  Initial Impression / Assessment and Plan / UC Course  I have reviewed the triage vital signs and the nursing notes.  Pertinent labs & imaging results that were available during my care of the patient were reviewed by me and considered in my medical decision making (see chart for details).    Suspect viral etiology of symptoms.  Unable to screen for influenza due to lack of resources but offered Tamiflu given current flu outbreak.  Patient would like to proceed with Tamiflu and will screen for COVID as well.  Recommended symptomatic treatment, increase fluids and rest otherwise.  Encouraged follow-up if symptoms fail to improve or worsen.  Final Clinical Impressions(s) / UC Diagnoses   Final diagnoses:  Acute upper respiratory infection  Encounter for screening for COVID-19   Discharge Instructions    None    ED Prescriptions     Medication Sig Dispense Auth. Provider   oseltamivir (TAMIFLU) 75 MG capsule  Take 1 capsule (75 mg total) by mouth every 12 (twelve) hours. 10 capsule Francene Finders, PA-C      PDMP not reviewed this encounter.   Francene Finders, PA-C 04/19/22 [phone]

## 2022-04-19 NOTE — ED Triage Notes (Signed)
Pt c/o sore throat, congestion and productive cough.   Started: Friday  Home interventions: OTC cough medication

## 2022-04-20 LAB — SARS CORONAVIRUS 2 (TAT 6-24 HRS): SARS Coronavirus 2: POSITIVE — AB

## 2022-04-23 NOTE — Progress Notes (Signed)
Complement level is elevated other labs are negative.  I am happy to get you to rheumatologist but I do not think they are going to determine that you do have a rheumatologic disorder.  Do want me to place that referral?

## 2022-04-24 ENCOUNTER — Encounter: Payer: 59 | Admitting: Rehabilitative and Restorative Service Providers"

## 2022-04-27 NOTE — Therapy (Incomplete)
OUTPATIENT OCCUPATIONAL THERAPY ORTHO EVALUATION  Patient Name: Scott Durham MRN: [id number] DOB:2001/02/08, 22 y.o., male Today's Date: 04/27/2022  PCP: Garret Reddish, MD REFERRING PROVIDER:  Gregor Hams, MD    END OF SESSION:   Past Medical History:  Diagnosis Date   Migraine    saw neurology in teenage years- now off all medicines. had been on amitriptyline, sumatriptan, and nauseas medicine    Past Surgical History:  Procedure Laterality Date   APPENDECTOMY     CHOLECYSTECTOMY     TONSILLECTOMY AND ADENOIDECTOMY     Patient Active Problem List   Diagnosis Date Noted   Insomnia 01/14/2022   Migraine    Allergic rhinitis 07/03/2015   Acne vulgaris 07/03/2015    ONSET DATE: ***  REFERRING DIAG: M79.641,M79.642 (ICD-10-CM) - Bilateral hand pain   THERAPY DIAG:  No diagnosis found.  Rationale for Evaluation and Treatment: Rehabilitation  SUBJECTIVE:   SUBJECTIVE STATEMENT: He states ***.    PERTINENT HISTORY: Per MD notes: "Diego Ulbricht is a 22 y.o. male who presents to [place] at [facility] today for cont'd bilat hand pain, R>L. Pt was last seen for this issue by Dr. Glennon Mac on 09/12/21 and was prescribed meloxicam. Pt was last seen by Dr. Georgina Snell on 07/22/20 and was referred to hand therapy. Today, pt reports bilat hand pain is about the same, R>L. 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 Pt locates pain to the thenar eminence on both hands and the 4th-5th fingers. "  PRECAUTIONS: {Therapy precautions:24002}  WEIGHT BEARING RESTRICTIONS: {Yes ***/No:24003}  PAIN:  Are you having pain? *** in *** Rating: ***/10 at rest now, up to ***/10 at worst in past week   FALLS: Has patient fallen in last 6 months? {fallsyesno:27318}  LIVING ENVIRONMENT: Lives with: {OPRC lives with:25569::"lives with their family"} Lives in: {Lives in:25570} Stairs: {opstairs:27293} Has following equipment at home: {Assistive  devices:23999}  PLOF: {PLOF:24004}  PATIENT GOALS: ***  OBJECTIVE: (All objective assessments below are from initial evaluation on: 04/30/22 unless otherwise specified.)    HAND DOMINANCE: Right ***  ADLs: Overall ADLs: States decreased ability to grab, hold household objects, pain and inability to open containers, perform FMS tasks (manipulate fasteners on clothing), mild to moderate bathing problems as well. ***   FUNCTIONAL OUTCOME MEASURES: Eval: Quck DASH ***% impairment today  (Higher % Score  =  More Impairment)    Patient Specific Functional Scale: *** (***, ***, ***)  (Higher Score  =  Better Ability for the Selected Tasks)     UPPER EXTREMITY ROM     Shoulder to Wrist AROM Right eval Left eval  Shoulder flexion    Shoulder abduction    Shoulder extension    Shoulder internal rotation    Shoulder external rotation    Elbow flexion    Elbow extension    Forearm supination    Forearm pronation     Wrist flexion    Wrist extension    Wrist ulnar deviation    Wrist radial deviation    Functional dart thrower's motion (F-DTM) in ulnar flexion    F-DTM in radial extension     (Blank rows = not tested)   Hand AROM Right eval Left eval  Full Fist Ability (or Gap to Distal Palmar Crease)    Thumb Opposition  (Kapandji Scale)     Thumb MCP (0-60)    Thumb IP (0-80)    Thumb Radial Abduction Span  Thumb Palmar Abduction Span     Index MCP (0-90)     Index PIP (0-100)     Index DIP (0-70)      Long MCP (0-90)      Long PIP (0-100)      Long DIP (0-70)      Ring MCP (0-90)      Ring PIP (0-100)      Ring DIP (0-70)      Little MCP (0-90)      Little PIP (0-100)      Little DIP (0-70)      (Blank rows = not tested)   UPPER EXTREMITY MMT:    Eval: *** NT at eval due to recent and still healing injuries. Will be tested when appropriate.   MMT Right TBD Left TBD  Shoulder flexion    Shoulder abduction    Shoulder adduction    Shoulder extension     Shoulder internal rotation    Shoulder external rotation    Middle trapezius    Lower trapezius    Elbow flexion    Elbow extension    Forearm supination    Forearm pronation    Wrist flexion    Wrist extension    Wrist ulnar deviation    Wrist radial deviation    (Blank rows = not tested)  HAND FUNCTION: Eval: Observed weakness in affected hand.  Grip strength Right: *** lbs, Left: *** lbs   COORDINATION: Eval: Observed coordination impairments with affected hand. Box and Blocks Test: *** Blocks today (*** is [facility]); 9 Hole Peg Test Right: ***sec, Left: *** sec (*** sec is WFL)   SENSATION: Eval: *** Light touch intact today, though diminished around sx area    EDEMA:   Eval: *** Mildly swollen in hand and wrist today, ***cm circumferentially around ***  COGNITION: Eval: Overall cognitive status: WFL for evaluation today ***  OBSERVATIONS:   Eval: ***   TODAY'S TREATMENT:  Post-evaluation treatment: ***    PATIENT EDUCATION: Education details: See tx section above for details  Person educated: Patient Education method: Veterinary surgeon, Teach back, Handouts  Education comprehension: States and demonstrates understanding, Additional Education required    HOME EXERCISE PROGRAM: See tx section above for details    GOALS: Goals reviewed with patient? Yes   SHORT TERM GOALS: (STG required if POC>30 days) Target Date: ***  Pt will obtain protective, custom orthotic. Goal status: TBD/PRN,  MET ***  2.  Pt will demo/state understanding of initial HEP to improve pain levels and prerequisite motion. Goal status: INITIAL   LONG TERM GOALS: Target Date: ***  Pt will improve functional ability by decreased impairment per Quick DASH / PSFS / PRWE assessment from *** to *** or better, for better quality of life. Goal status: INITIAL  2.  Pt will improve grip strength in *** hand from ***lbs to at least ***lbs for functional use at home and in IADLs. Goal  status: INITIAL  3.  Pt will improve A/ROM in *** from *** to at least ***, to have functional motion for tasks like reach and grasp.  Goal status: INITIAL  4.  Pt will improve strength in *** from *** MMT to at least *** MMT to have increased functional ability to carry out selfcare and higher-level homecare tasks with no difficulty. Goal status: INITIAL  5.  Pt will improve coordination skills in ***, as seen by better score on *** testing to have increased functional ability to carry out fine  motor tasks (fasteners, etc.) and more complex, coordinated IADLs (meal prep, sports, etc.).  Goal status: INITIAL  6.  Pt will decrease pain at worst from ***/10 to ***/10 or better to have better sleep and occupational participation in daily roles. Goal status: INITIAL  ASSESSMENT:  CLINICAL IMPRESSION: Patient is a 22 y.o. male who was seen today for occupational therapy evaluation for ***.   PERFORMANCE DEFICITS: in functional skills including {OT physical skills:25468}, cognitive skills including {OT cognitive skills:25469}, and psychosocial skills including {OT psychosocial skills:25470}.   IMPAIRMENTS: are limiting patient from {OT performance deficits:25471}.   COMORBIDITIES: {Comorbidities:25485} that affects occupational performance. Patient will benefit from skilled OT to address above impairments and improve overall function.  MODIFICATION OR ASSISTANCE TO COMPLETE EVALUATION: {OT modification:25474}  OT OCCUPATIONAL PROFILE AND HISTORY: {OT PROFILE AND HISTORY:25484}  CLINICAL DECISION MAKING: {OT CDM:25475}  REHAB POTENTIAL: {rehabpotential:25112}  EVALUATION COMPLEXITY: {Evaluation complexity:25115}      PLAN:  OT FREQUENCY: {rehab frequency:25116}  OT DURATION: {rehab duration:25117}  PLANNED INTERVENTIONS: {OT Interventions:25467}  RECOMMENDED OTHER SERVICES: ***  CONSULTED AND AGREED WITH PLAN OF CARE: {CVE:93810}  PLAN FOR NEXT SESSION: ***   Fannie Knee, OTR/L, CHT 04/27/2022, 11:33 AM

## 2022-04-30 ENCOUNTER — Encounter: Payer: 59 | Admitting: Rehabilitative and Restorative Service Providers"

## 2022-05-26 ENCOUNTER — Ambulatory Visit: Payer: 59 | Admitting: Family Medicine

## 2022-07-02 ENCOUNTER — Encounter: Payer: Self-pay | Admitting: Family Medicine

## 2022-07-09 ENCOUNTER — Ambulatory Visit: Payer: 59 | Admitting: Family Medicine

## 2022-07-14 ENCOUNTER — Ambulatory Visit (INDEPENDENT_AMBULATORY_CARE_PROVIDER_SITE_OTHER): Payer: 59 | Admitting: Family Medicine

## 2022-07-14 ENCOUNTER — Encounter: Payer: Self-pay | Admitting: Family Medicine

## 2022-07-14 VITALS — BP 124/80 | HR 57 | Temp 97.8°F | Ht 70.0 in | Wt 221.4 lb

## 2022-07-14 DIAGNOSIS — L299 Pruritus, unspecified: Secondary | ICD-10-CM | POA: Diagnosis not present

## 2022-07-14 DIAGNOSIS — F5101 Primary insomnia: Secondary | ICD-10-CM | POA: Diagnosis not present

## 2022-07-14 LAB — COMPREHENSIVE METABOLIC PANEL
ALT: 52 U/L (ref 0–53)
AST: 28 U/L (ref 0–37)
Albumin: 4.6 g/dL (ref 3.5–5.2)
Alkaline Phosphatase: 74 U/L (ref 39–117)
BUN: 15 mg/dL (ref 6–23)
CO2: 28 mEq/L (ref 19–32)
Calcium: 9.5 mg/dL (ref 8.4–10.5)
Chloride: 104 mEq/L (ref 96–112)
Creatinine, Ser: 0.95 mg/dL (ref 0.40–1.50)
GFR: 114.59 mL/min (ref >60.00)
Glucose, Bld: 87 mg/dL (ref 70–99)
Potassium: 4.3 mEq/L (ref 3.5–5.1)
Sodium: 140 mEq/L (ref 135–145)
Total Bilirubin: 0.8 mg/dL (ref 0.2–1.2)
Total Protein: 6.8 g/dL (ref 6.0–8.3)

## 2022-07-14 LAB — CBC WITH DIFFERENTIAL/PLATELET
Basophils Absolute: 0 10*3/uL (ref 0.0–0.1)
Basophils Relative: 0.6 % (ref 0.0–3.0)
Eosinophils Absolute: 0.1 10*3/uL (ref 0.0–0.7)
Eosinophils Relative: 1.6 % (ref 0.0–5.0)
HCT: 45.9 % (ref 39.0–52.0)
Hemoglobin: 16.2 g/dL (ref 13.0–17.0)
Lymphocytes Relative: 37.2 % (ref 12.0–46.0)
Lymphs Abs: 2.5 10*3/uL (ref 0.7–4.0)
MCHC: 35.4 g/dL (ref 30.0–36.0)
MCV: 86.2 fl (ref 78.0–100.0)
Monocytes Absolute: 0.6 10*3/uL (ref 0.1–1.0)
Monocytes Relative: 8.3 % (ref 3.0–12.0)
Neutro Abs: 3.6 10*3/uL (ref 1.4–7.7)
Neutrophils Relative %: 52.3 % (ref 43.0–77.0)
Platelets: 260 10*3/uL (ref 150.0–400.0)
RBC: 5.33 Mil/uL (ref 4.22–5.81)
RDW: 13.2 % (ref 11.5–15.5)
WBC: 6.8 10*3/uL (ref 4.0–10.5)

## 2022-07-14 LAB — TSH: TSH: 1.99 u[IU]/mL (ref 0.35–5.50)

## 2022-07-14 MED ORDER — TRIAMCINOLONE ACETONIDE 0.1 % EX CREA
1.0000 | TOPICAL_CREAM | Freq: Two times a day (BID) | CUTANEOUS | 1 refills | Status: DC
Start: 2022-07-14 — End: 2022-08-25

## 2022-07-14 MED ORDER — HYDROXYZINE HCL 25 MG PO TABS: 25.0000 mg | ORAL_TABLET | Freq: Every evening | ORAL | 3 refills | Status: AC | PRN

## 2022-07-14 NOTE — Patient Instructions (Addendum)
Please stop by lab before you go If you have mychart- we will send your results within 3 business days of [place] receiving them.  If you do not have mychart- we will call you about results within 5 business days of [place] receiving them.  *please also note that you will see labs on mychart as soon as they post. I will later go in and write notes on them- will say "notes from Dr. Durene Cal"   Restart hydroxyzine and see if that is helpful-also sent in triamcinolone to trial if needed as topical cream  Recommended follow up: Return for as needed for new, worsening, persistent symptoms.
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
## 2022-08-25 ENCOUNTER — Ambulatory Visit (INDEPENDENT_AMBULATORY_CARE_PROVIDER_SITE_OTHER): Payer: 59 | Admitting: Physician Assistant

## 2022-08-25 ENCOUNTER — Encounter: Payer: Self-pay | Admitting: Physician Assistant

## 2022-08-25 VITALS — BP 120/86 | HR 69 | Temp 98.2°F | Ht 70.0 in | Wt 221.4 lb

## 2022-08-25 DIAGNOSIS — S61213A Laceration without foreign body of left middle finger without damage to nail, initial encounter: Secondary | ICD-10-CM

## 2022-08-25 MED ORDER — CEPHALEXIN 500 MG PO CAPS: 500.0000 mg | ORAL_CAPSULE | Freq: Three times a day (TID) | ORAL | 0 refills | Status: AC

## 2022-08-25 NOTE — Progress Notes (Signed)
Scott Durham is a 22 y.o. male here for a follow up of a pre-existing problem.  History of Present Illness:   Chief Complaint  Patient presents with   Laceration    Pt cut his middle finger a week ago. Having some pain. Took Tylenol.    Laceration     Finger cut Patient cut middle finger of left hand with a knife on May 10th while cutting food Went to urgent care on the 16th because finger kept bleeding Tried a finger splint, coban, keeping wrapped regularly  UpToDate on tetanus  Has had some slight clear drainage from area No pain, fevers, chills      Past Medical History:  Diagnosis Date   Migraine    saw neurology in teenage years- now off all medicines. had been on amitriptyline, sumatriptan, and nauseas medicine      Social History   Tobacco Use   Smoking status: Never   Smokeless tobacco: Never  Vaping Use   Vaping Use: Never used  Substance Use Topics   Alcohol use: No   Drug use: Yes    Types: Marijuana    Past Surgical History:  Procedure Laterality Date   APPENDECTOMY     CHOLECYSTECTOMY     TONSILLECTOMY AND ADENOIDECTOMY      Family History  Problem Relation Age of Onset   Hiatal hernia Mother    GER disease Mother    Other Father        unknown history   Hyperlipidemia Maternal Grandmother    Hyperlipidemia Maternal Grandfather    Aortic aneurysm Paternal Grandmother        had surgery   COPD Paternal Grandmother    Other Paternal Grandfather        back pain issue,    Cancer - Other Paternal Grandfather     Allergies  Allergen Reactions   Doxycycline Anaphylaxis, Swelling and Rash   Penicillins Rash   Benzoyl Peroxide     hives   Adhesive [Tape] Rash    Current Medications:   Current Outpatient Medications:    acetaminophen (TYLENOL) 325 MG tablet, Take 650 mg by mouth every 4 (four) hours as needed., Disp: , Rfl:    hydrOXYzine (ATARAX) 25 MG tablet, Take 1 tablet (25 mg total) by mouth at bedtime as needed  (insomnia/anxiety.). Take one 25 mg tablet 30-60 minutes prior to bedtime for insomnia., Disp: 90 tablet, Rfl: 3   Review of Systems:   ROS Negative unless otherwise specified per HPI.  Vitals:   Vitals:   08/25/22 0940  BP: 120/86  Pulse: 69  Temp: 98.2 F (36.8 C)  TempSrc: Temporal  SpO2: 98%  Weight: 221 lb 6.1 oz (100.4 kg)  Height: 5\' 10"  (1.778 m)     Body mass index is 31.76 kg/m.  Physical Exam:   Physical Exam Vitals and nursing note reviewed.  Constitutional:      Appearance: He is well-developed.  HENT:     Head: Normocephalic.  Eyes:     Conjunctiva/sclera: Conjunctivae normal.     Pupils: Pupils are equal, round, and reactive to light.  Pulmonary:     Effort: Pulmonary effort is normal.  Musculoskeletal:        General: Normal range of motion.     Cervical back: Normal range of motion.  Skin:    General: Skin is warm and dry.     Comments: 1 cm C-shaped well-approximated but slightly opened laceration to R middle finger distal interphalangeal joint;  slight erythema; no TTP or purulent discharge present  Neurological:     Mental Status: He is alert and oriented to person, place, and time.  Psychiatric:        Behavior: Behavior normal.        Thought Content: Thought content normal.        Judgment: Judgment normal.     Assessment and Plan:   Laceration of left middle finger without damage to nail, foreign body presence unspecified, initial encounter Challenging location for healing and due to adhesive allergy Recommend continued splinting, keeping area moist and bandaged, and xray to rule out foreign body  Safety net prescription of keflex provided if any signs/symptom(s) of infection -- do not feel like he needs this currently  Follow-up based on symptom(s) results    Jarold Motto, PA-C

## 2022-08-25 NOTE — Patient Instructions (Signed)
It was great to see you!  Better wound care -- keep area moist with either bacitracin or aquaphor - keep covered  Keep the splint in place when at work  An order for xray has been put in for you. To have this done, you can walk in at the [facility] location without a scheduled appointment.  The address is [address] It is across the street from [facility]. [place] are located in the basement.   Hours of operation are M-F 8:30am to 5:00pm.  Please note that they are closed for lunch between 12:30 and 1:00pm.  Keep me posted on symptoms so I can advise further on next steps.  Take care,  Jarold Motto PA-C

## 2022-11-06 ENCOUNTER — Encounter: Payer: 59 | Admitting: Family Medicine

## 2023-02-16 ENCOUNTER — Ambulatory Visit: Payer: Self-pay | Admitting: Family Medicine

## 2023-05-03 ENCOUNTER — Encounter: Payer: Self-pay | Admitting: Family Medicine
